# Patient Record
Sex: Female | Born: 1985
Health system: Southern US, Community
[De-identification: ages and names within clinical notes are randomized; demographics above are authoritative.]

## PROBLEM LIST (undated history)

## (undated) DIAGNOSIS — O139 Gestational [pregnancy-induced] hypertension without significant proteinuria, unspecified trimester: Secondary | ICD-10-CM

## (undated) DIAGNOSIS — B009 Herpesviral infection, unspecified: Secondary | ICD-10-CM

---

## 2004-01-29 ENCOUNTER — Emergency Department (HOSPITAL_COMMUNITY): Admission: EM | Admit: 2004-01-29 | Discharge: 2004-01-29 | Payer: Self-pay | Admitting: Emergency Medicine

## 2006-08-09 ENCOUNTER — Emergency Department (HOSPITAL_COMMUNITY): Admission: EM | Admit: 2006-08-09 | Discharge: 2006-08-09 | Payer: Self-pay | Admitting: *Deleted

## 2006-09-14 ENCOUNTER — Encounter: Admission: RE | Admit: 2006-09-14 | Discharge: 2006-09-14 | Payer: Self-pay | Admitting: Orthopedic Surgery

## 2006-10-06 ENCOUNTER — Encounter: Admission: RE | Admit: 2006-10-06 | Discharge: 2006-10-06 | Payer: Self-pay | Admitting: Sports Medicine

## 2007-01-10 ENCOUNTER — Emergency Department (HOSPITAL_COMMUNITY): Admission: EM | Admit: 2007-01-10 | Discharge: 2007-01-10 | Payer: Self-pay | Admitting: Family Medicine

## 2012-04-16 ENCOUNTER — Other Ambulatory Visit (HOSPITAL_COMMUNITY): Payer: Self-pay | Admitting: Nurse Practitioner

## 2012-04-16 DIAGNOSIS — R599 Enlarged lymph nodes, unspecified: Secondary | ICD-10-CM

## 2012-04-19 ENCOUNTER — Ambulatory Visit (HOSPITAL_COMMUNITY)
Admission: RE | Admit: 2012-04-19 | Discharge: 2012-04-19 | Disposition: A | Discharge status: Home / Self Care | Payer: 59 | Source: Ambulatory Visit | Attending: Nurse Practitioner | Admitting: Nurse Practitioner

## 2012-04-19 DIAGNOSIS — R220 Localized swelling, mass and lump, head: Secondary | ICD-10-CM | POA: Insufficient documentation

## 2012-04-19 DIAGNOSIS — R221 Localized swelling, mass and lump, neck: Secondary | ICD-10-CM | POA: Insufficient documentation

## 2012-04-19 DIAGNOSIS — R599 Enlarged lymph nodes, unspecified: Secondary | ICD-10-CM

## 2012-04-19 DIAGNOSIS — M542 Cervicalgia: Secondary | ICD-10-CM | POA: Insufficient documentation

## 2012-04-19 DIAGNOSIS — E042 Nontoxic multinodular goiter: Secondary | ICD-10-CM | POA: Insufficient documentation

## 2012-04-19 MED ORDER — IOHEXOL 300 MG/ML  SOLN
100.0000 mL | Freq: Once | INTRAMUSCULAR | Status: AC | PRN
  Administered 2012-04-19: 100 mL via INTRAVENOUS

## 2012-05-12 ENCOUNTER — Ambulatory Visit (INDEPENDENT_AMBULATORY_CARE_PROVIDER_SITE_OTHER): Payer: 59 | Admitting: Physician Assistant

## 2012-05-12 VITALS — BP 105/72 | HR 66 | Temp 98.1°F | Resp 16 | Ht 61.5 in | Wt 145.0 lb

## 2012-05-12 DIAGNOSIS — B373 Candidiasis of vulva and vagina: Secondary | ICD-10-CM

## 2012-05-12 DIAGNOSIS — J020 Streptococcal pharyngitis: Secondary | ICD-10-CM

## 2012-05-12 DIAGNOSIS — J069 Acute upper respiratory infection, unspecified: Secondary | ICD-10-CM

## 2012-05-12 DIAGNOSIS — J029 Acute pharyngitis, unspecified: Secondary | ICD-10-CM

## 2012-05-12 MED ORDER — FLUCONAZOLE 150 MG PO TABS: 150.0000 mg | ORAL_TABLET | Freq: Once | ORAL | Status: AC

## 2012-05-12 MED ORDER — MAGIC MOUTHWASH W/LIDOCAINE: 5.0000 mL | Freq: Four times a day (QID) | ORAL | Status: DC | PRN

## 2012-05-12 MED ORDER — AMOXICILLIN 875 MG PO TABS: 875.0000 mg | ORAL_TABLET | Freq: Two times a day (BID) | ORAL | Status: AC

## 2012-05-12 NOTE — Progress Notes (Signed)
    Patient ID: Rebecca Barnett MRN: [id number], DOB: [birth year], 26 y.o. Date of Encounter: 05/12/2012, 12:55 PM  Primary Physician: No primary provider on file.  Chief Complaint:  Chief Complaint  Patient presents with  . Sore Throat    3 days    HPI: 26 y.o. year old female presents with a three day history of sore throat. Subjective fever and chills. No cough, congestion, rhinorrhea, sinus pressure, otalgia, or headache. Normal hearing. No GI complaints. Able to swallow saliva, but hurts to do so. Decreased appetite secondary to sore throat.   No past medical history on file.   Home Meds: Prior to Admission medications   Medication Sig Start Date End Date Taking? Authorizing Provider  Multiple Vitamin (MULTIVITAMIN) tablet Take 1 tablet by mouth daily.   Yes Historical Provider, MD  Norgestim-Eth Estrad Triphasic (TRI-SPRINTEC PO) Take by mouth.   Yes Historical Provider, MD    Allergies: No Known Allergies  History   Social History  . Marital Status: Single    Spouse Name: N/A    Number of Children: N/A  . Years of Education: N/A   Occupational History  . Not on file.   Social History Main Topics  . Smoking status: Never Smoker   . Smokeless tobacco: Not on file  . Alcohol Use: Yes     social  . Drug Use: Not on file  . Sexually Active: Not on file   Other Topics Concern  . Not on file   Social History Narrative  . No narrative on file     Review of Systems: Constitutional: negative for night sweats or weight changes HEENT: see above Cardiovascular: negative for chest pain or palpitations Respiratory: negative for hemoptysis, wheezing, or shortness of breath Abdominal: negative for abdominal pain, nausea, vomiting or diarrhea Dermatological: negative for rash Neurologic: negative for headache   Physical Exam: Blood pressure 105/72, pulse 66, temperature 98.1 F (36.7 C), temperature source Oral, resp. rate 16, height 5' 1.5" (1.562 m), weight 145  lb (65.772 kg), last menstrual period 04/19/2012, SpO2 100.00%., Body mass index is 26.95 kg/(m^2). General: Well developed, well nourished, in no acute distress. Head: Normocephalic, atraumatic, eyes without discharge, sclera non-icteric, nares are patent. Bilateral auditory canals clear, TM's are without perforation, pearly grey with reflective cone of light bilaterally. No sinus TTP. Oral cavity moist, dentition normal. Posterior pharynx with post nasal drip and mild erythema. No peritonsillar abscess or tonsillar exudate. Neck: Supple. No thyromegaly. Full ROM. No lymphadenopathy. Lungs: Clear bilaterally to auscultation without wheezes, rales, or rhonchi. Breathing is unlabored. Heart: RRR with S1 S2. No murmurs, rubs, or gallops appreciated. Msk:  Strength and tone normal for age. Extremities: No clubbing or cyanosis. No edema. Neuro: Alert and oriented X 3. Moves all extremities spontaneously. CNII-XII grossly in tact. Psych:  Responds to questions appropriately with a normal affect.   Labs: Results for orders placed in visit on 05/12/12  POCT RAPID STREP A (OFFICE)      Component Value Range   Rapid Strep A Screen Negative  Negative    TC pending  ASSESSMENT AND PLAN:  26 y.o. year old female with strep pharyngitis -Amoxicillin 875 mg 1 po bid #20 no RF -DMM -Tylenol/Motrin prn -New tooth brush -Diflucan 150 mg #1 1 po at end of antibiotic if needed RF 1 -Rest/fluids -RTC precautions -RTC 3-5 days if no improvement  Signed, Eula Listen, PA-C 05/12/2012 12:55 PM

## 2012-05-14 NOTE — Progress Notes (Signed)
Quick Note:  Await final results.  Eula Listen, PA-C 05/14/2012 1:54 PM ______

## 2012-05-15 LAB — CULTURE, GROUP A STREP: Organism ID, Bacteria: NORMAL

## 2014-06-19 ENCOUNTER — Encounter (INDEPENDENT_AMBULATORY_CARE_PROVIDER_SITE_OTHER): Payer: Self-pay

## 2014-06-19 ENCOUNTER — Encounter: Payer: Self-pay | Admitting: Internal Medicine

## 2014-06-19 ENCOUNTER — Ambulatory Visit (INDEPENDENT_AMBULATORY_CARE_PROVIDER_SITE_OTHER): Payer: 59 | Admitting: Internal Medicine

## 2014-06-19 ENCOUNTER — Other Ambulatory Visit (INDEPENDENT_AMBULATORY_CARE_PROVIDER_SITE_OTHER): Payer: 59

## 2014-06-19 VITALS — BP 124/88 | HR 106 | Ht 61.0 in | Wt 152.0 lb

## 2014-06-19 DIAGNOSIS — F5104 Psychophysiologic insomnia: Secondary | ICD-10-CM | POA: Insufficient documentation

## 2014-06-19 DIAGNOSIS — E0590 Thyrotoxicosis, unspecified without thyrotoxic crisis or storm: Secondary | ICD-10-CM

## 2014-06-19 DIAGNOSIS — G4700 Insomnia, unspecified: Secondary | ICD-10-CM

## 2014-06-19 LAB — T4: T4 TOTAL: 13 ug/dL — AB (ref 5.0–12.5)

## 2014-06-19 LAB — TSH: TSH: 0.7 u[IU]/mL (ref 0.35–4.50)

## 2014-06-19 NOTE — Patient Instructions (Signed)
We can continue Ativan for now on an as-needed basis for sleep  Ok to take an occasional nap if really needed, but keep it short  Avoid caffeine after supper time- maybe earlier if you are sensitive  Order- referral to Dr Dellia CloudGutterman       Dx Chronic Insomnia  Order lab- TSH and T4    Dx hyperthryroid

## 2014-06-19 NOTE — Progress Notes (Signed)
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 She says she falls asleep easily between 10 and 11 PM most nights, between 1 and 2 AM on other nights. Wake toss and turn in several times during the night. Sleep latency 10 or 15 minutes. If up for bathroom may be hard to fall asleep again. Busy brain. Ready to sleep again at 5 AM. Married without reports that she snores much but husband complains about her restlessness. 2 cups of coffee in the morning. Weight gain since she got married 2 years ago. Trazodone help for a while and then stopped. Recently started Ativan 1 mg which seems to be helping some. She keeps bedroom cold and dark but admits staring at her lighted cell phone at bedtime. Extensive family history of insomnia. She reports having a sleep study a couple of years ago but can't say where and isn't sure who ordered it, never got that report. Dr. Tedra SenegalBland's office has no record.  Prior to Admission medications   Medication Sig Start Date End Date Taking? Authorizing Provider  LORazepam (ATIVAN) 1 MG tablet Take 1 mg by mouth every 8 (eight) hours as needed for anxiety.   Yes Historical Provider, MD  Multiple Vitamin (MULTIVITAMIN) tablet Take 1 tablet by mouth daily.   Yes Historical Provider, MD  Norgestim-Eth Estrad Triphasic (TRI-SPRINTEC PO) Take by mouth.   Yes Historical Provider, MD   Past Medical History  Diagnosis Date  . Arthritis    History reviewed. No pertinent past surgical history. Family History  Problem Relation Age of Onset  . Alcoholism Father   . Depression Father   . Hypertension Mother   . Insomnia Mother    History   Social History  . Marital Status: Single    Spouse Name: N/A    Number of Children: N/A  . Years of Education: N/A   Occupational History  . Not on file.   Social History Main Topics  . Smoking status: Never Smoker    . Smokeless tobacco: Not on file  . Alcohol Use: Yes     Comment: social  . Drug Use: Not on file  . Sexual Activity: Not on file   Other Topics Concern  . Not on file   Social History Narrative  . No narrative on file   ROS-see HPI Constitutional:   No-   weight loss, night sweats, fevers, chills, fatigue, lassitude. HEENT:  +headaches,no- difficulty swallowing, tooth/dental problems, sore throat,       No-  sneezing, itching, ear ache, nasal congestion, post nasal drip,  CV:  No-   chest pain, orthopnea, PND, swelling in lower extremities, anasarca,                                  dizziness, palpitations Resp: No-   shortness of breath with exertion or at rest.              No-   productive cough,  No non-productive cough,  No- coughing up of blood.              No-   change in color of mucus.  No- wheezing.   Skin: No-   rash or lesions. GI:  No-   heartburn, +indigestion, abdominal pain, nausea, vomiting, diarrhea,  change in bowel habits, loss of appetite GU: No-   dysuria, change in color of urine, no urgency or frequency.  No- flank pain. MS:  No-   joint pain or swelling.  No- decreased range of motion.  No- back pain. Neuro-     nothing unusual Psych:  No- change in mood or affect. + depression or anxiety.  No memory loss.  OBJ- Physical Exam General- Alert, Oriented, Affect-appropriate, Distress- none acute, medium build Skin- rash-none, lesions- none, excoriation- none Lymphadenopathy- none Head- atraumatic            Eyes- Gross vision intact, PERRLA, conjunctivae and secretions clear            Ears- Hearing, canals-normal            Nose- Clear, no-Septal dev, mucus, polyps, erosion, perforation             Throat- Mallampati III , mucosa clear , drainage- none, tonsils- atrophic Neck- flexible , trachea midline, no stridor , thyroid nl, carotid no bruit Chest - symmetrical excursion , unlabored           Heart/CV- RRR , no murmur , no gallop  ,  no rub, nl s1 s2                           - JVD- none , edema- none, stasis changes- none, varices- none           Lung- clear to P&A, wheeze- none, cough- none , dullness-none, rub- none           Chest wall-  Abd- tender-no, distended-no, bowel sounds-present, HSM- no Br/ Gen/ Rectal- Not done, not indicated Extrem- cyanosis- none, clubbing, none, atrophy- none, strength- nl Neuro- grossly intact to observation

## 2014-06-19 NOTE — Assessment & Plan Note (Addendum)
No difficulty initiating sleep but waking frequently with difficulty regaining sleep. History does not suggest sleep apnea. It would be good if we can find an old sleep study but she is really unclear about where that was done. We discussed sleep hygiene, medications and management of her reported depression/anxiety. Need to exclude hyperthyroidism Plan-okay to use Ativan intermittently as she is doing. Referral to behavioral health/ Dr Dawayne CirriGutterman's group to explore interaction of her anxiety with insomnia, lab for thyroid function

## 2014-08-19 ENCOUNTER — Ambulatory Visit: Payer: 59 | Admitting: Internal Medicine

## 2014-09-26 ENCOUNTER — Ambulatory Visit: Payer: 59 | Admitting: Internal Medicine

## 2014-10-06 ENCOUNTER — Encounter (INDEPENDENT_AMBULATORY_CARE_PROVIDER_SITE_OTHER): Payer: Self-pay

## 2014-10-06 ENCOUNTER — Ambulatory Visit (INDEPENDENT_AMBULATORY_CARE_PROVIDER_SITE_OTHER): Payer: 59 | Admitting: Internal Medicine

## 2014-10-06 ENCOUNTER — Encounter: Payer: Self-pay | Admitting: Internal Medicine

## 2014-10-06 VITALS — BP 118/64 | HR 88 | Ht 61.0 in | Wt 151.4 lb

## 2014-10-06 DIAGNOSIS — F5104 Psychophysiologic insomnia: Secondary | ICD-10-CM

## 2014-10-06 DIAGNOSIS — G4700 Insomnia, unspecified: Secondary | ICD-10-CM

## 2014-10-06 MED ORDER — CLONAZEPAM 0.5 MG PO TABS: ORAL_TABLET | ORAL | Status: DC

## 2014-10-06 NOTE — Assessment & Plan Note (Signed)
Primarily difficulty maintaining sleep, with frequent waking after sleep onset. We will first try a longer half-life med and see if Behavioral Health can help. Then consider sleep study or empiric therapy for limb movement.

## 2014-10-06 NOTE — Progress Notes (Signed)
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 She says she falls asleep easily between 10 and 11 PM most nights, between 1 and 2 AM on other nights. Wake toss and turn in several times during the night. Sleep latency 10 or 15 minutes. If up for bathroom may be hard to fall asleep again. Busy brain. Ready to sleep again at 5 AM. Married without reports that she snores much but husband complains about her restlessness. 2 cups of coffee in the morning. Weight gain since she got married 2 years ago. Trazodone help for a while and then stopped. Recently started Ativan 1 mg which seems to be helping some. She keeps bedroom cold and dark but admits staring at her lighted cell phone at bedtime. Extensive family history of insomnia. She reports having a sleep study a couple of years ago but can't say where and isn't sure who ordered it, never got that report. Dr. Tedra SenegalBland's office has no record.  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  FOLLOW FOR:  Chronic Insomnia, has only slept approx 5 hours last night, woke up with headache today; pt thinks she may have some food allergies and would like to be tested for allergies She noted nonspecific malaise after eating wheat/ pasta- mostly sleep disturbance not clearly associated with food. Lorazepam taken intermittently, not helping as mch any more. Has tried meditation, trazodone.  Mostly notes ok sleep onset, but waking during night. Husband doesn't notice snoring or movement disturbance.  [facility], but she was never contacted.   ROS-see HPI Constitutional:   No-   weight loss, night sweats, fevers, chills,+ fatigue, lassitude. HEENT:   +headaches,no- difficulty swallowing, tooth/dental problems, sore throat,       No-  sneezing, itching, ear ache, nasal congestion, post nasal drip,  CV:  No-   chest pain, orthopnea, PND, swelling in lower extremities, anasarca,                                  dizziness, palpitations Resp: No-   shortness of breath with exertion or at rest.              No-   productive cough,  No non-productive cough,  No- coughing up of blood.              No-   change in color of mucus.  No- wheezing.   Skin: No-   rash or lesions. GI:  No-   heartburn, +indigestion, abdominal pain, nausea, vomiting,  GU:  MS:  No-   joint pain or swelling.   Neuro-     nothing unusual Psych:  No- change in mood or affect. + depression or anxiety.  No memory loss.  OBJ- Physical Exam General- Alert, Oriented, Affect-appropriate, Distress- none acute, medium build Skin- rash-none, lesions- none, excoriation- none Lymphadenopathy- none Head- atraumatic            Eyes- Gross vision intact, PERRLA, conjunctivae and secretions clear            Ears- Hearing, canals-normal  Nose- Clear, no-Septal dev, mucus, polyps, erosion, perforation             Throat- Mallampati III , mucosa clear , drainage- none, tonsils- atrophic Neck- flexible , trachea midline, no stridor , thyroid nl, carotid no bruit Chest - symmetrical excursion , unlabored           Heart/CV- RRR , no murmur , no gallop  , no rub, nl s1 s2                           - JVD- none , edema- none, stasis changes- none, varices- none           Lung- clear to P&A, wheeze- none, cough- none , dullness-none, rub- none           Chest wall-  Abd-  Br/ Gen/ Rectal- Not done, not indicated Extrem- cyanosis- none, clubbing, none, atrophy- none, strength- nl Neuro- grossly intact to observation

## 2014-10-06 NOTE — Patient Instructions (Signed)
Script to try clonazepam for sleep. Try taking this 20-30 minutes before bedtime.  [facility]  [facility] Dellia CloudGutterman    Insomnia

## 2015-01-06 ENCOUNTER — Ambulatory Visit: Payer: 59 | Admitting: Internal Medicine

## 2015-05-01 LAB — OB RESULTS CONSOLE ANTIBODY SCREEN: Antibody Screen: NEGATIVE

## 2015-05-01 LAB — OB RESULTS CONSOLE ABO/RH: RH TYPE: POSITIVE

## 2015-05-01 LAB — OB RESULTS CONSOLE HEPATITIS B SURFACE ANTIGEN: HEP B S AG: NEGATIVE

## 2015-05-01 LAB — OB RESULTS CONSOLE RUBELLA ANTIBODY, IGM: RUBELLA: IMMUNE

## 2015-05-01 LAB — OB RESULTS CONSOLE RPR: RPR: NONREACTIVE

## 2015-05-01 LAB — OB RESULTS CONSOLE HIV ANTIBODY (ROUTINE TESTING): HIV: NONREACTIVE

## 2015-05-13 LAB — OB RESULTS CONSOLE GC/CHLAMYDIA
Chlamydia: NEGATIVE
Gonorrhea: NEGATIVE

## 2015-11-09 LAB — OB RESULTS CONSOLE GBS: STREP GROUP B AG: NEGATIVE

## 2015-11-30 ENCOUNTER — Inpatient Hospital Stay (HOSPITAL_COMMUNITY): Payer: 59 | Admitting: Anesthesiology

## 2015-11-30 ENCOUNTER — Encounter (HOSPITAL_COMMUNITY)
Admission: AD | Disposition: A | Discharge status: Home / Self Care | Payer: Self-pay | Source: Ambulatory Visit | Attending: Obstetrics and Gynecology

## 2015-11-30 ENCOUNTER — Inpatient Hospital Stay (HOSPITAL_COMMUNITY)
Admission: AD | Admit: 2015-11-30 | Discharge: 2015-12-03 | DRG: 765 | Disposition: A | Discharge status: Home / Self Care | Payer: 59 | Source: Ambulatory Visit | Attending: Obstetrics and Gynecology | Admitting: Obstetrics and Gynecology

## 2015-11-30 ENCOUNTER — Encounter (HOSPITAL_COMMUNITY): Payer: Self-pay

## 2015-11-30 DIAGNOSIS — Z8249 Family history of ischemic heart disease and other diseases of the circulatory system: Secondary | ICD-10-CM

## 2015-11-30 DIAGNOSIS — IMO0001 Reserved for inherently not codable concepts without codable children: Secondary | ICD-10-CM

## 2015-11-30 DIAGNOSIS — D62 Acute posthemorrhagic anemia: Secondary | ICD-10-CM | POA: Diagnosis not present

## 2015-11-30 DIAGNOSIS — Z3A39 39 weeks gestation of pregnancy: Secondary | ICD-10-CM

## 2015-11-30 DIAGNOSIS — O26893 Other specified pregnancy related conditions, third trimester: Secondary | ICD-10-CM | POA: Diagnosis present

## 2015-11-30 DIAGNOSIS — A6000 Herpesviral infection of urogenital system, unspecified: Secondary | ICD-10-CM | POA: Diagnosis present

## 2015-11-30 DIAGNOSIS — Z87891 Personal history of nicotine dependence: Secondary | ICD-10-CM | POA: Diagnosis not present

## 2015-11-30 DIAGNOSIS — R609 Edema, unspecified: Secondary | ICD-10-CM | POA: Diagnosis not present

## 2015-11-30 DIAGNOSIS — O9832 Other infections with a predominantly sexual mode of transmission complicating childbirth: Secondary | ICD-10-CM | POA: Diagnosis present

## 2015-11-30 DIAGNOSIS — O365990 Maternal care for other known or suspected poor fetal growth, unspecified trimester, not applicable or unspecified: Secondary | ICD-10-CM | POA: Diagnosis present

## 2015-11-30 DIAGNOSIS — O9081 Anemia of the puerperium: Secondary | ICD-10-CM | POA: Diagnosis not present

## 2015-11-30 HISTORY — DX: Herpesviral infection, unspecified: B00.9

## 2015-11-30 LAB — AMNISURE RUPTURE OF MEMBRANE (ROM) NOT AT ARMC: Amnisure ROM: POSITIVE

## 2015-11-30 LAB — CBC
HCT: 34.4 % — ABNORMAL LOW (ref 36.0–46.0)
HEMOGLOBIN: 11.8 g/dL — AB (ref 12.0–15.0)
MCH: 29.9 pg (ref 26.0–34.0)
MCHC: 34.3 g/dL (ref 30.0–36.0)
MCV: 87.3 fL (ref 78.0–100.0)
PLATELETS: 262 10*3/uL (ref 150–400)
RBC: 3.94 MIL/uL (ref 3.87–5.11)
RDW: 13.8 % (ref 11.5–15.5)
WBC: 10.6 10*3/uL — AB (ref 4.0–10.5)

## 2015-11-30 LAB — TYPE AND SCREEN
ABO/RH(D): A POS
ANTIBODY SCREEN: NEGATIVE

## 2015-11-30 LAB — RPR: RPR: NONREACTIVE

## 2015-11-30 LAB — ABO/RH: ABO/RH(D): A POS

## 2015-11-30 SURGERY — Surgical Case
Anesthesia: Regional

## 2015-11-30 MED ORDER — SIMETHICONE 80 MG PO CHEW
80.0000 mg | CHEWABLE_TABLET | ORAL | Status: DC
  Administered 2015-12-01 – 2015-12-02 (×2): 80 mg via ORAL
  Filled 2015-11-30 (×2): qty 1

## 2015-11-30 MED ORDER — SODIUM CHLORIDE 0.9 % IJ SOLN: 3.0000 mL | INTRAMUSCULAR | Status: DC | PRN

## 2015-11-30 MED ORDER — IBUPROFEN 600 MG PO TABS
600.0000 mg | ORAL_TABLET | Freq: Four times a day (QID) | ORAL | Status: DC
  Administered 2015-12-01 – 2015-12-03 (×10): 600 mg via ORAL
  Filled 2015-11-30 (×10): qty 1

## 2015-11-30 MED ORDER — LACTATED RINGERS IV SOLN
INTRAVENOUS | Status: DC
Start: 2015-11-30 — End: 2015-11-30
  Administered 2015-11-30: 13:00:00 via INTRAUTERINE

## 2015-11-30 MED ORDER — OXYCODONE-ACETAMINOPHEN 5-325 MG PO TABS
1.0000 | ORAL_TABLET | ORAL | Status: DC | PRN
  Administered 2015-12-01: 1 via ORAL
  Filled 2015-11-30: qty 1

## 2015-11-30 MED ORDER — SIMETHICONE 80 MG PO CHEW
80.0000 mg | CHEWABLE_TABLET | ORAL | Status: DC | PRN
  Filled 2015-11-30: qty 1

## 2015-11-30 MED ORDER — OXYTOCIN BOLUS FROM INFUSION: 500.0000 mL | INTRAVENOUS | Status: DC

## 2015-11-30 MED ORDER — PRENATAL MULTIVITAMIN CH
1.0000 | ORAL_TABLET | Freq: Every day | ORAL | Status: DC
  Administered 2015-12-01 – 2015-12-03 (×3): 1 via ORAL
  Filled 2015-11-30 (×3): qty 1

## 2015-11-30 MED ORDER — NALOXONE HCL 2 MG/2ML IJ SOSY: 1.0000 ug/kg/h | PREFILLED_SYRINGE | INTRAVENOUS | Status: DC | PRN

## 2015-11-30 MED ORDER — METHYLERGONOVINE MALEATE 0.2 MG PO TABS: 0.2000 mg | ORAL_TABLET | ORAL | Status: DC | PRN

## 2015-11-30 MED ORDER — DEXAMETHASONE SODIUM PHOSPHATE 4 MG/ML IJ SOLN
INTRAMUSCULAR | Status: AC
  Filled 2015-11-30: qty 1

## 2015-11-30 MED ORDER — MEPERIDINE HCL 25 MG/ML IJ SOLN: 6.2500 mg | INTRAMUSCULAR | Status: DC | PRN

## 2015-11-30 MED ORDER — NALBUPHINE HCL 10 MG/ML IJ SOLN: 5.0000 mg | Freq: Once | INTRAMUSCULAR | Status: DC | PRN

## 2015-11-30 MED ORDER — DEXAMETHASONE SODIUM PHOSPHATE 4 MG/ML IJ SOLN
INTRAMUSCULAR | Status: DC | PRN
  Administered 2015-11-30: 4 mg via INTRAVENOUS

## 2015-11-30 MED ORDER — LACTATED RINGERS IV SOLN
INTRAVENOUS | Status: DC | PRN
  Administered 2015-11-30: 18:00:00 via INTRAVENOUS

## 2015-11-30 MED ORDER — DIPHENHYDRAMINE HCL 25 MG PO CAPS: 25.0000 mg | ORAL_CAPSULE | ORAL | Status: DC | PRN

## 2015-11-30 MED ORDER — LIDOCAINE HCL (PF) 1 % IJ SOLN: 30.0000 mL | INTRAMUSCULAR | Status: DC | PRN

## 2015-11-30 MED ORDER — OXYCODONE-ACETAMINOPHEN 5-325 MG PO TABS: 1.0000 | ORAL_TABLET | ORAL | Status: DC | PRN

## 2015-11-30 MED ORDER — ZOLPIDEM TARTRATE 5 MG PO TABS: 5.0000 mg | ORAL_TABLET | Freq: Every evening | ORAL | Status: DC | PRN

## 2015-11-30 MED ORDER — BUPIVACAINE HCL (PF) 0.25 % IJ SOLN
INTRAMUSCULAR | Status: DC | PRN
  Administered 2015-11-30: 6 mL

## 2015-11-30 MED ORDER — BUPIVACAINE HCL (PF) 0.25 % IJ SOLN
INTRAMUSCULAR | Status: AC
  Filled 2015-11-30: qty 10

## 2015-11-30 MED ORDER — BISACODYL 10 MG RE SUPP: 10.0000 mg | Freq: Every day | RECTAL | Status: DC | PRN

## 2015-11-30 MED ORDER — DIBUCAINE 1 % RE OINT: 1.0000 "application " | TOPICAL_OINTMENT | RECTAL | Status: DC | PRN

## 2015-11-30 MED ORDER — FERROUS SULFATE 325 (65 FE) MG PO TABS
325.0000 mg | ORAL_TABLET | Freq: Two times a day (BID) | ORAL | Status: DC
  Administered 2015-12-01 – 2015-12-03 (×5): 325 mg via ORAL
  Filled 2015-11-30 (×5): qty 1

## 2015-11-30 MED ORDER — ACETAMINOPHEN 325 MG PO TABS: 650.0000 mg | ORAL_TABLET | ORAL | Status: DC | PRN

## 2015-11-30 MED ORDER — FLEET ENEMA 7-19 GM/118ML RE ENEM: 1.0000 | ENEMA | RECTAL | Status: DC | PRN

## 2015-11-30 MED ORDER — OXYTOCIN 40 UNITS IN LACTATED RINGERS INFUSION - SIMPLE MED: 62.5000 mL/h | INTRAVENOUS | Status: AC

## 2015-11-30 MED ORDER — KETOROLAC TROMETHAMINE 30 MG/ML IJ SOLN
INTRAMUSCULAR | Status: AC
  Filled 2015-11-30: qty 1

## 2015-11-30 MED ORDER — PROMETHAZINE HCL 25 MG/ML IJ SOLN: 6.2500 mg | INTRAMUSCULAR | Status: DC | PRN

## 2015-11-30 MED ORDER — PHENYLEPHRINE 40 MCG/ML (10ML) SYRINGE FOR IV PUSH (FOR BLOOD PRESSURE SUPPORT)
PREFILLED_SYRINGE | INTRAVENOUS | Status: AC
  Filled 2015-11-30: qty 10

## 2015-11-30 MED ORDER — LANOLIN HYDROUS EX OINT: 1.0000 "application " | TOPICAL_OINTMENT | CUTANEOUS | Status: DC | PRN

## 2015-11-30 MED ORDER — LACTATED RINGERS IV SOLN
500.0000 mL | INTRAVENOUS | Status: DC | PRN
  Administered 2015-11-30: 1000 mL via INTRAVENOUS
  Administered 2015-11-30: 500 mL via INTRAVENOUS

## 2015-11-30 MED ORDER — ONDANSETRON HCL 4 MG/2ML IJ SOLN
INTRAMUSCULAR | Status: AC
  Filled 2015-11-30: qty 2

## 2015-11-30 MED ORDER — BUPIVACAINE HCL (PF) 0.25 % IJ SOLN
INTRAMUSCULAR | Status: AC
  Filled 2015-11-30: qty 20

## 2015-11-30 MED ORDER — OXYTOCIN 10 UNIT/ML IJ SOLN
INTRAMUSCULAR | Status: AC
  Filled 2015-11-30: qty 4

## 2015-11-30 MED ORDER — MORPHINE SULFATE (PF) 0.5 MG/ML IJ SOLN
INTRAMUSCULAR | Status: AC
  Filled 2015-11-30: qty 10

## 2015-11-30 MED ORDER — MORPHINE SULFATE (PF) 0.5 MG/ML IJ SOLN
INTRAMUSCULAR | Status: DC | PRN
  Administered 2015-11-30: 4 mg via EPIDURAL

## 2015-11-30 MED ORDER — PHENYLEPHRINE 40 MCG/ML (10ML) SYRINGE FOR IV PUSH (FOR BLOOD PRESSURE SUPPORT)
80.0000 ug | PREFILLED_SYRINGE | INTRAVENOUS | Status: AC | PRN
  Administered 2015-11-30 (×3): 80 ug via INTRAVENOUS
  Filled 2015-11-30: qty 20

## 2015-11-30 MED ORDER — SODIUM BICARBONATE 8.4 % IV SOLN
INTRAVENOUS | Status: DC | PRN
  Administered 2015-11-30 (×3): 5 mL via EPIDURAL

## 2015-11-30 MED ORDER — KETOROLAC TROMETHAMINE 30 MG/ML IJ SOLN
30.0000 mg | Freq: Four times a day (QID) | INTRAMUSCULAR | Status: DC | PRN
  Administered 2015-11-30: 30 mg via INTRAMUSCULAR

## 2015-11-30 MED ORDER — WITCH HAZEL-GLYCERIN EX PADS: 1.0000 "application " | MEDICATED_PAD | CUTANEOUS | Status: DC | PRN

## 2015-11-30 MED ORDER — NALBUPHINE HCL 10 MG/ML IJ SOLN: 5.0000 mg | INTRAMUSCULAR | Status: DC | PRN

## 2015-11-30 MED ORDER — LIDOCAINE-EPINEPHRINE (PF) 2 %-1:200000 IJ SOLN
INTRAMUSCULAR | Status: AC
  Filled 2015-11-30: qty 20

## 2015-11-30 MED ORDER — SODIUM CHLORIDE 0.9 % IV SOLN: 250.0000 mL | INTRAVENOUS | Status: DC

## 2015-11-30 MED ORDER — SODIUM BICARBONATE 8.4 % IV SOLN
INTRAVENOUS | Status: AC
  Filled 2015-11-30: qty 50

## 2015-11-30 MED ORDER — FENTANYL CITRATE (PF) 100 MCG/2ML IJ SOLN
25.0000 ug | INTRAMUSCULAR | Status: DC | PRN
  Administered 2015-11-30: 50 ug via INTRAVENOUS

## 2015-11-30 MED ORDER — NALBUPHINE HCL 10 MG/ML IJ SOLN
5.0000 mg | INTRAMUSCULAR | Status: DC | PRN
  Administered 2015-11-30 – 2015-12-01 (×2): 5 mg via INTRAVENOUS
  Filled 2015-11-30 (×2): qty 1

## 2015-11-30 MED ORDER — TERBUTALINE SULFATE 1 MG/ML IJ SOLN
0.2500 mg | Freq: Once | INTRAMUSCULAR | Status: DC | PRN
Start: 2015-11-30 — End: 2015-11-30

## 2015-11-30 MED ORDER — DIPHENHYDRAMINE HCL 50 MG/ML IJ SOLN: 12.5000 mg | INTRAMUSCULAR | Status: DC | PRN

## 2015-11-30 MED ORDER — FLEET ENEMA 7-19 GM/118ML RE ENEM: 1.0000 | ENEMA | Freq: Every day | RECTAL | Status: DC | PRN

## 2015-11-30 MED ORDER — CEFAZOLIN SODIUM-DEXTROSE 2-3 GM-% IV SOLR
INTRAVENOUS | Status: DC | PRN
  Administered 2015-11-30: 2 g via INTRAVENOUS

## 2015-11-30 MED ORDER — NALOXONE HCL 0.4 MG/ML IJ SOLN: 0.4000 mg | INTRAMUSCULAR | Status: DC | PRN

## 2015-11-30 MED ORDER — KETOROLAC TROMETHAMINE 30 MG/ML IJ SOLN: 30.0000 mg | Freq: Four times a day (QID) | INTRAMUSCULAR | Status: DC | PRN

## 2015-11-30 MED ORDER — OXYTOCIN 40 UNITS IN LACTATED RINGERS INFUSION - SIMPLE MED
1.0000 m[IU]/min | INTRAVENOUS | Status: DC
  Administered 2015-11-30: 2 m[IU]/min via INTRAVENOUS
  Filled 2015-11-30: qty 1000

## 2015-11-30 MED ORDER — BUTORPHANOL TARTRATE 2 MG/ML IJ SOLN
2.0000 mg | INTRAMUSCULAR | Status: DC | PRN
  Administered 2015-11-30: 2 mg via INTRAVENOUS
  Filled 2015-11-30 (×2): qty 2

## 2015-11-30 MED ORDER — SCOPOLAMINE 1 MG/3DAYS TD PT72: 1.0000 | MEDICATED_PATCH | Freq: Once | TRANSDERMAL | Status: DC

## 2015-11-30 MED ORDER — LACTATED RINGERS IV SOLN
INTRAVENOUS | Status: DC
  Administered 2015-11-30 – 2015-12-01 (×3): via INTRAVENOUS

## 2015-11-30 MED ORDER — EPHEDRINE 5 MG/ML INJ: 10.0000 mg | INTRAVENOUS | Status: DC | PRN

## 2015-11-30 MED ORDER — ONDANSETRON HCL 4 MG/2ML IJ SOLN
4.0000 mg | Freq: Three times a day (TID) | INTRAMUSCULAR | Status: DC | PRN
  Administered 2015-12-01: 4 mg via INTRAVENOUS
  Filled 2015-11-30: qty 2

## 2015-11-30 MED ORDER — FENTANYL 2.5 MCG/ML BUPIVACAINE 1/10 % EPIDURAL INFUSION (WH - ANES)
14.0000 mL/h | INTRAMUSCULAR | Status: DC | PRN
Start: 2015-11-30 — End: 2015-11-30
  Filled 2015-11-30: qty 125

## 2015-11-30 MED ORDER — FENTANYL 2.5 MCG/ML BUPIVACAINE 1/10 % EPIDURAL INFUSION (WH - ANES): 14.0000 mL/h | INTRAMUSCULAR | Status: DC | PRN

## 2015-11-30 MED ORDER — OXYCODONE-ACETAMINOPHEN 5-325 MG PO TABS
2.0000 | ORAL_TABLET | ORAL | Status: DC | PRN
  Administered 2015-12-01 – 2015-12-02 (×6): 2 via ORAL
  Filled 2015-11-30 (×6): qty 2

## 2015-11-30 MED ORDER — LACTATED RINGERS IV SOLN
INTRAVENOUS | Status: DC
  Administered 2015-11-30 (×2): via INTRAVENOUS

## 2015-11-30 MED ORDER — SIMETHICONE 80 MG PO CHEW
80.0000 mg | CHEWABLE_TABLET | Freq: Three times a day (TID) | ORAL | Status: DC
  Administered 2015-12-01 – 2015-12-03 (×6): 80 mg via ORAL
  Filled 2015-11-30 (×6): qty 1

## 2015-11-30 MED ORDER — SCOPOLAMINE 1 MG/3DAYS TD PT72
MEDICATED_PATCH | TRANSDERMAL | Status: AC
  Filled 2015-11-30: qty 1

## 2015-11-30 MED ORDER — SODIUM CHLORIDE 0.9 % IJ SOLN: 3.0000 mL | Freq: Two times a day (BID) | INTRAMUSCULAR | Status: DC

## 2015-11-30 MED ORDER — FENTANYL 2.5 MCG/ML BUPIVACAINE 1/10 % EPIDURAL INFUSION (WH - ANES)
INTRAMUSCULAR | Status: DC | PRN
  Administered 2015-11-30: 14 mL/h via EPIDURAL

## 2015-11-30 MED ORDER — SCOPOLAMINE 1 MG/3DAYS TD PT72
MEDICATED_PATCH | TRANSDERMAL | Status: DC | PRN
  Administered 2015-11-30: 1 via TRANSDERMAL

## 2015-11-30 MED ORDER — METHYLERGONOVINE MALEATE 0.2 MG/ML IJ SOLN: 0.2000 mg | INTRAMUSCULAR | Status: DC | PRN

## 2015-11-30 MED ORDER — OXYTOCIN 40 UNITS IN LACTATED RINGERS INFUSION - SIMPLE MED: 62.5000 mL/h | INTRAVENOUS | Status: DC

## 2015-11-30 MED ORDER — DIPHENHYDRAMINE HCL 25 MG PO CAPS: 25.0000 mg | ORAL_CAPSULE | Freq: Four times a day (QID) | ORAL | Status: DC | PRN

## 2015-11-30 MED ORDER — OXYCODONE-ACETAMINOPHEN 5-325 MG PO TABS: 2.0000 | ORAL_TABLET | ORAL | Status: DC | PRN

## 2015-11-30 MED ORDER — LACTATED RINGERS IV SOLN
40.0000 [IU] | INTRAVENOUS | Status: DC | PRN
  Administered 2015-11-30: 40 [IU] via INTRAVENOUS

## 2015-11-30 MED ORDER — MENTHOL 3 MG MT LOZG
1.0000 | LOZENGE | OROMUCOSAL | Status: DC | PRN
  Administered 2015-12-02: 3 mg via ORAL
  Filled 2015-11-30: qty 9

## 2015-11-30 MED ORDER — SENNOSIDES-DOCUSATE SODIUM 8.6-50 MG PO TABS
2.0000 | ORAL_TABLET | ORAL | Status: DC
Start: 2015-12-01 — End: 2015-12-03
  Administered 2015-12-01 – 2015-12-02 (×3): 2 via ORAL
  Filled 2015-11-30 (×3): qty 2

## 2015-11-30 MED ORDER — CITRIC ACID-SODIUM CITRATE 334-500 MG/5ML PO SOLN
30.0000 mL | ORAL | Status: DC | PRN
  Administered 2015-11-30: 30 mL via ORAL
  Filled 2015-11-30: qty 15

## 2015-11-30 MED ORDER — FENTANYL CITRATE (PF) 100 MCG/2ML IJ SOLN
INTRAMUSCULAR | Status: AC
  Filled 2015-11-30: qty 2

## 2015-11-30 MED ORDER — ACETAMINOPHEN 500 MG PO TABS
1000.0000 mg | ORAL_TABLET | Freq: Four times a day (QID) | ORAL | Status: AC
  Administered 2015-12-01: 1000 mg via ORAL
  Filled 2015-11-30 (×2): qty 2

## 2015-11-30 MED ORDER — LACTATED RINGERS IV SOLN
INTRAVENOUS | Status: DC | PRN
  Administered 2015-11-30 (×3): via INTRAVENOUS

## 2015-11-30 MED ORDER — LIDOCAINE HCL (PF) 1 % IJ SOLN
INTRAMUSCULAR | Status: DC | PRN
  Administered 2015-11-30 (×2): 5 mL

## 2015-11-30 MED ORDER — ONDANSETRON HCL 4 MG/2ML IJ SOLN
4.0000 mg | Freq: Four times a day (QID) | INTRAMUSCULAR | Status: DC | PRN
  Administered 2015-11-30: 4 mg via INTRAVENOUS

## 2015-11-30 SURGICAL SUPPLY — 44 items
BARRIER ADHS 3X4 INTERCEED (GAUZE/BANDAGES/DRESSINGS) ×3 IMPLANT
BENZOIN TINCTURE PRP APPL 2/3 (GAUZE/BANDAGES/DRESSINGS) ×3 IMPLANT
CLAMP CORD UMBIL (MISCELLANEOUS) IMPLANT
CLOSURE WOUND 1/2 X4 (GAUZE/BANDAGES/DRESSINGS) ×2
CLOTH BEACON ORANGE TIMEOUT ST (SAFETY) ×3 IMPLANT
CONTAINER PREFILL 10% NBF 15ML (MISCELLANEOUS) IMPLANT
DRAPE C SECTION CLR SCREEN (DRAPES) ×3 IMPLANT
DRAPE SHEET LG 3/4 BI-LAMINATE (DRAPES) ×3 IMPLANT
DRSG OPSITE POSTOP 4X10 (GAUZE/BANDAGES/DRESSINGS) ×3 IMPLANT
DURAPREP 26ML APPLICATOR (WOUND CARE) ×3 IMPLANT
ELECT REM PT RETURN 9FT ADLT (ELECTROSURGICAL) ×3
ELECTRODE REM PT RTRN 9FT ADLT (ELECTROSURGICAL) ×1 IMPLANT
EXTRACTOR VACUUM M CUP 4 TUBE (SUCTIONS) IMPLANT
EXTRACTOR VACUUM M CUP 4' TUBE (SUCTIONS)
GLOVE BIOGEL PI IND STRL 7.0 (GLOVE) ×2 IMPLANT
GLOVE BIOGEL PI INDICATOR 7.0 (GLOVE) ×4
GLOVE ECLIPSE 6.5 STRL STRAW (GLOVE) ×3 IMPLANT
GOWN STRL REUS W/TWL LRG LVL3 (GOWN DISPOSABLE) ×6 IMPLANT
KIT ABG SYR 3ML LUER SLIP (SYRINGE) IMPLANT
NEEDLE HYPO 22GX1.5 SAFETY (NEEDLE) ×3 IMPLANT
NEEDLE HYPO 25X5/8 SAFETYGLIDE (NEEDLE) IMPLANT
NS IRRIG 1000ML POUR BTL (IV SOLUTION) ×3 IMPLANT
PACK C SECTION WH (CUSTOM PROCEDURE TRAY) ×3 IMPLANT
PAD OB MATERNITY 4.3X12.25 (PERSONAL CARE ITEMS) ×3 IMPLANT
RTRCTR C-SECT PINK 25CM LRG (MISCELLANEOUS) IMPLANT
STRIP CLOSURE SKIN 1/2X4 (GAUZE/BANDAGES/DRESSINGS) ×4 IMPLANT
SUT CHROMIC GUT AB #0 18 (SUTURE) IMPLANT
SUT MNCRL 0 VIOLET CTX 36 (SUTURE) ×3 IMPLANT
SUT MON AB 2-0 SH 27 (SUTURE)
SUT MON AB 2-0 SH27 (SUTURE) IMPLANT
SUT MON AB 3-0 SH 27 (SUTURE)
SUT MON AB 3-0 SH27 (SUTURE) IMPLANT
SUT MON AB 4-0 PS1 27 (SUTURE) IMPLANT
SUT MONOCRYL 0 CTX 36 (SUTURE) ×6
SUT PLAIN 2 0 (SUTURE)
SUT PLAIN 2 0 XLH (SUTURE) ×3 IMPLANT
SUT PLAIN ABS 2-0 CT1 27XMFL (SUTURE) IMPLANT
SUT VIC AB 0 CT1 36 (SUTURE) ×6 IMPLANT
SUT VIC AB 2-0 CT1 27 (SUTURE) ×2
SUT VIC AB 2-0 CT1 TAPERPNT 27 (SUTURE) ×1 IMPLANT
SUT VIC AB 4-0 PS2 27 (SUTURE) ×3 IMPLANT
SYR CONTROL 10ML LL (SYRINGE) ×3 IMPLANT
TOWEL OR 17X24 6PK STRL BLUE (TOWEL DISPOSABLE) ×3 IMPLANT
TRAY FOLEY CATH SILVER 14FR (SET/KITS/TRAYS/PACK) IMPLANT

## 2015-11-30 NOTE — MAU Note (Signed)
Pt states that she woke up around 315, she felt a pop and noticed a big gush of yellow fluid. Contractions every 4-6 mins. Has felt baby move once since 0315.

## 2015-11-30 NOTE — Anesthesia Preprocedure Evaluation (Signed)
Anesthesia Evaluation  Patient identified by MRN, date of birth, ID band Patient awake    Reviewed: Allergy & Precautions, NPO status , Patient's Chart, lab work & pertinent test results  Airway Mallampati: II       Dental  (+) Dental Advisory Given   Pulmonary neg pulmonary ROS, former smoker,    breath sounds clear to auscultation       Cardiovascular negative cardio ROS   Rhythm:Regular     Neuro/Psych negative neurological ROS     GI/Hepatic negative GI ROS, Neg liver ROS,   Endo/Other  negative endocrine ROS  Renal/GU negative Renal ROS     Musculoskeletal   Abdominal   Peds negative pediatric ROS (+)  Hematology negative hematology ROS (+)   Anesthesia Other Findings Insomnia  Reproductive/Obstetrics (+) Pregnancy                             Anesthesia Physical  Anesthesia Plan  ASA: II  Anesthesia Plan: Epidural   Post-op Pain Management:    Induction:   Airway Management Planned:   Additional Equipment:   Intra-op Plan:   Post-operative Plan:   Informed Consent: I have reviewed the patients History and Physical, chart, labs and discussed the procedure including the risks, benefits and alternatives for the proposed anesthesia with the patient or authorized representative who has indicated his/her understanding and acceptance.     Plan Discussed with:   Anesthesia Plan Comments:         Anesthesia Quick Evaluation

## 2015-11-30 NOTE — Op Note (Signed)
NAMEthelda Chick:  Kwasnik, Daje           ACCOUNT NO.:  [id number]  MEDICAL RECORD NO.:  [id number]  LOCATION:  9146                          FACILITY:  [facility]  PHYSICIAN:  Maxie BetterSheronette Delona Clasby, M.D.DATE OF BIRTH:  [birth year]  DATE OF PROCEDURE:  11/30/2015 DATE OF DISCHARGE:                              OPERATIVE REPORT   PREOPERATIVE DIAGNOSES:  Fetal intolerance to labor, arrest of the dilatation, term gestation.  PROCEDURE:  Primary cesarean section, Kerr hysterotomy.  POSTOPERATIVE DIAGNOSES:  Fetal intolerance to labor, right occiput posterior presentation, arrest of the dilatation, term gestation.  ANESTHESIA:  Epidural.  SURGEON:  Maxie BetterSheronette Louis Ivery, MD  ASSISTANT:  Donette LarryMelanie Bhambri, CNM  DESCRIPTION OF PROCEDURE:  Under adequate epidural anesthesia, the patient was placed in the supine position with a left lateral tilt.  She was sterilely prepped and draped in usual fashion.  Indwelling Foley catheter was already in place.  A 0.25% Marcaine was injected along the planned Pfannenstiel skin incision site.  Pfannenstiel skin incision was then made, carried down to the rectus fascia.  Rectus fascia was opened transversely.  The rectus fascia was then bluntly and sharply dissected off the rectus muscle in superior and inferior fashion.  The rectus muscle was split in the midline.  The parietal peritoneum was entered bluntly and extended. Clear serous fluid was noted.  The Alexis retractor was then placed.  Vesicouterine peritoneum was opened transversely.  The bladder was then gently dissected off the lower uterine segment and displaced inferiorly with a bladder retractor.  A curvilinear low transverse uterine incision was then made and extended with bandage scissors.  The copious light meconium stained yellow fluid was noted.  Loop of cord was noted.  The baby's right fingers was also noted in the field.  Subsequent delivery of a live female from the right occiput posterior position  was accomplished with the cord draped around the mid forehead and the neck which was then reduced.  Baby was delivered.  Baby was bulb suctioned in the abdomen.  Cord was clamped, cut.  The baby was transferred to the awaiting pediatrician who assigned Apgars of 8 and 9 at 1 and 5 minute respectively.  The placenta which was posterior was manually removed.  Uterine cavity was cleaned of debris.  Uterine incision was then closed in 2 layers, the first layer with 0 Monocryl running locked stitch, second layer was imbricated using 0 Monocryl suture.  Good hemostasis was noted.  Normal tubes and ovaries were noted bilaterally.  Two small left paratubal cyst was noted, they remained.  Abdomen was irrigated and suctioned.  The self-retaining retractor was removed.  Interceed in inverted T-fashion was then placed. The parietal peritoneum was then closed with 2-0 Vicryl.  The rectus fascia was then closed with 0 Vicryl x2.  The subcutaneous area was irrigated, small bleeders cauterized.  Interrupted 2-0 plain sutures placed and the skin was approximated with 4-0 Vicryl subcuticular sutures and Steri-Strips were placed.  SPECIMENS:  Placenta sent to Pathology.  ESTIMATED BLOOD LOSS:  800 mL.  INTRAOPERATIVE FLUID:  3 L.  URINE OUTPUT:  225 mL urine.  COUNTS:  Sponge and instrument counts x2 was correct.  COMPLICATION:  None.  The patient tolerated  the procedure well, was transferred to recovery in stable condition.     Maxie Better, M.D.     [place]/[facility]  D:  11/30/2015  T:  11/30/2015  Job:  [id number]

## 2015-11-30 NOTE — Consult Note (Signed)
[facility]  Delivery Note:  C-section       11/30/2015  6:24 PM  I was called to the operating room at the request of the patient's obstetrician (Dr. Cousins) for a repeat c-section.  PRENATAL HX:  29 y/o G1P0 at 39 and 4/[redacted] weeks GA admitted today in active labor with ROM at 0300 this morning (ROM ~15 hours).  Fluid was notable for thick meconium.  Delivery was by c-section for failure to progress  DELIVERY:  Infant was vigorous at delivery, requiring no resuscitation other than standard warming, drying and stimulation.  APGARs 8 and 9.  Exam within normal limits.  After 5 minutes, baby left with nurse to assist parents with skin-to-skin care.   _____________________ Electronically Signed By: Alexyss Balzarini, MD Neonatologist  

## 2015-11-30 NOTE — Brief Op Note (Signed)
11/30/2015  6:51 PM  PATIENT:  Stanford BreedKennitrish N Carrick  29 y.o. female  PRE-OPERATIVE DIAGNOSIS:  Fetal intolerance to labor, arrest of dilation, term gestation  POST-OPERATIVE DIAGNOSIS:  Fetal intolerance to labor, arrest of dilation, term gestation  PROCEDURE:  Primary cesarean section. Sharl MaKerr hysterotomy  SURGEON:  Surgeon(s) and Role:    * Maxie BetterSheronette Arbadella Kimbler, MD - Primary  PHYSICIAN ASSISTANT:   ASSISTANTS: Donette LarryMelanie Bhambri, CNM   ANESTHESIA:   epidural Findings: live  Female, CAN x1  and  Around mid face, right hand in field. Nl tubes and ovaries, light yellow mec fluid. Post placenta Apgar 8/9 EBL:  Total I/O In: 3000 [I.V.:3000] Out: 1025 [Urine:225; Blood:800]  BLOOD ADMINISTERED:none  DRAINS: none   LOCAL MEDICATIONS USED:  MARCAINE     SPECIMEN:  Source of Specimen:  placenta  DISPOSITION OF SPECIMEN:  PATHOLOGY  COUNTS:  YES  TOURNIQUET:  * No tourniquets in log *  DICTATION: .Other Dictation: Dictation Number [id number]  PLAN OF CARE: Admit to inpatient   PATIENT DISPOSITION:  PACU - hemodynamically stable.   Delay start of Pharmacological VTE agent (>24hrs) due to surgical blood loss or risk of bleeding: no

## 2015-11-30 NOTE — Progress Notes (Signed)
Rebecca Barnett is a 29 y.o. G1P0 at 9363w4d by ultrasound admitted for rupture of membranes  Subjective: Chief Complaint  Patient presents with  . Labor Eval  notes some rectal pressure Epidural Pitocin 6 miu Objective: BP 117/61 mmHg  Pulse 87  Temp(Src) 98.4 F (36.9 C) (Oral)  Resp 20  Ht 5' 1.5" (1.562 m)  Wt 90.266 kg (199 lb)  BMI 37.00 kg/m2  SpO2 98%      FHT:  FHR: 125 bpm, variability: moderate,  accelerations:  Present,  decelerations:  Present variable decel UC:   irregular, every 2-4 minutes SVE:   4 cm dilated, edematous cervix station, -2 IUPC/ amnioinfusion.  ISE placed  Labs: Lab Results  Component Value Date   WBC 10.6* 11/30/2015   HGB 11.8* 11/30/2015   HCT 34.4* 11/30/2015   MCV 87.3 11/30/2015   PLT 262 11/30/2015    Assessment / Plan: Arrest in active phase of labor Term gestation P) right exaggerated sims. Cont pitocin/amnioinfusion Anticipated MOD:  guarded  Rebecca Barnett A 11/30/2015, 2:29 PM

## 2015-11-30 NOTE — Anesthesia Preprocedure Evaluation (Addendum)
Anesthesia Evaluation  Patient identified by MRN, date of birth, ID band Patient awake    Reviewed: Allergy & Precautions, NPO status , Patient's Chart, lab work & pertinent test results  Airway Mallampati: II       Dental  (+) Dental Advisory Given   Pulmonary neg pulmonary ROS, former smoker,    breath sounds clear to auscultation       Cardiovascular negative cardio ROS   Rhythm:Regular     Neuro/Psych negative neurological ROS     GI/Hepatic negative GI ROS, Neg liver ROS,   Endo/Other  negative endocrine ROS  Renal/GU negative Renal ROS     Musculoskeletal   Abdominal   Peds negative pediatric ROS (+)  Hematology negative hematology ROS (+)   Anesthesia Other Findings Insomnia  Reproductive/Obstetrics (+) Pregnancy                           Anesthesia Physical Anesthesia Plan  ASA: II  Anesthesia Plan: Epidural   Post-op Pain Management:    Induction:   Airway Management Planned:   Additional Equipment:   Intra-op Plan:   Post-operative Plan:   Informed Consent: I have reviewed the patients History and Physical, chart, labs and discussed the procedure including the risks, benefits and alternatives for the proposed anesthesia with the patient or authorized representative who has indicated his/her understanding and acceptance.     Plan Discussed with:   Anesthesia Plan Comments:         Anesthesia Quick Evaluation                                  Anesthesia Evaluation  Patient identified by MRN, date of birth, ID band Patient awake    Reviewed: Allergy & Precautions, NPO status , Patient's Chart, lab work & pertinent test results  Airway Mallampati: II       Dental  (+) Dental Advisory Given   Pulmonary neg pulmonary ROS, former smoker,    breath sounds clear to auscultation       Cardiovascular negative cardio ROS   Rhythm:Regular      Neuro/Psych negative neurological ROS     GI/Hepatic negative GI ROS, Neg liver ROS,   Endo/Other  negative endocrine ROS  Renal/GU negative Renal ROS     Musculoskeletal   Abdominal   Peds negative pediatric ROS (+)  Hematology negative hematology ROS (+)   Anesthesia Other Findings Insomnia  Reproductive/Obstetrics (+) Pregnancy                             Anesthesia Physical  Anesthesia Plan  ASA: II  Anesthesia Plan: Epidural   Post-op Pain Management:    Induction:   Airway Management Planned:   Additional Equipment:   Intra-op Plan:   Post-operative Plan:   Informed Consent: I have reviewed the patients History and Physical, chart, labs and discussed the procedure including the risks, benefits and alternatives for the proposed anesthesia with the patient or authorized representative who has indicated his/her understanding and acceptance.     Plan Discussed with:   Anesthesia Plan Comments:         Anesthesia Quick Evaluation

## 2015-11-30 NOTE — MAU Note (Signed)
PT  SAYS  SHE   THINKS  SROM  AT  0300.   VE IN OFFICE - FT- 1  CM.     HAS  HX  OF  HSV - LAST OUTBREAK - NONE  DURING  PREG-  NO VALTREX.       NO MRSA.    GBS-  NEG

## 2015-11-30 NOTE — Transfer of Care (Signed)
Immediate Anesthesia Transfer of Care Note  Patient: Rebecca Barnett  Procedure(s) Performed: Procedure(s): CESAREAN SECTION (N/A)  Patient Location: PACU  Anesthesia Type:Epidural  Level of Consciousness: awake, alert  and oriented  Airway & Oxygen Therapy: Patient Spontanous Breathing  Post-op Assessment: Report given to RN and Post -op Vital signs reviewed and stable  Post vital signs: Reviewed and stable  Last Vitals:  Filed Vitals:   11/30/15 1736 11/30/15 1739  BP: 137/74 128/80  Pulse:    Temp:    Resp:      Complications: No apparent anesthesia complications

## 2015-11-30 NOTE — H&P (Signed)
Rebecca Barnett is a 29 y.o. female presenting for admission due to SROm @ 3 am clear fluid. (+) irreg ctx. GBS cx neg . Maternal Medical History:  Reason for admission: Rupture of membranes and contractions.   Contractions: Onset was 3-5 hours ago.   Frequency: irregular.    Fetal activity: Perceived fetal activity is normal.    Prenatal complications: no prenatal complications   OB History    Gravida Para Term Preterm AB TAB SAB Ectopic Multiple Living   1              Past Medical History  Diagnosis Date  . HSV-2 (herpes simplex virus 2) infection    History reviewed. No pertinent past surgical history. Family History: family history includes Alcoholism in her father; Depression in her father; Hypertension in her mother; Insomnia in her mother. Social History:  reports that she has quit smoking. She does not have any smokeless tobacco history on file. She reports that she drinks alcohol. She reports that she does not use illicit drugs.   Prenatal Transfer Tool  Maternal Diabetes: No Genetic Screening: Normal Maternal Ultrasounds/Referrals: Normal Fetal Ultrasounds or other Referrals:  None Maternal Substance Abuse:  No Significant Maternal Medications:  None Significant Maternal Lab Results:  Lab values include: Group B Strep negative Other Comments:  None  ROS neg   Blood pressure 78/43, pulse 71, temperature 98.2 F (36.8 C), temperature source Oral, resp. rate 18, height 5' 1.5" (1.562 m), weight 90.266 kg (199 lb), SpO2 99 %. Exam Physical Exam  Constitutional: She is oriented to person, place, and time. She appears well-developed and well-nourished.  HENT:  Head: Atraumatic.  Eyes: EOM are normal.  Neck: Neck supple.  Cardiovascular: Regular rhythm.   Respiratory: Breath sounds normal.  GI: Soft.  Musculoskeletal: Normal range of motion. She exhibits edema.  Neurological: She is alert and oriented to person, place, and time.  Skin: Skin is warm and  dry.  Psychiatric: She has a normal mood and affect.   VE 2/80/-2 midposition  Prenatal labs: ABO, Rh: --/--/A POS (12/05 0630) Antibody: NEG (12/05 0630) Rubella: Immune (05/06 0000) RPR: Nonreactive (05/06 0000)  HBsAg: Negative (05/06 0000)  HIV: Non-reactive (05/06 0000)  GBS: Negative (11/14 0000)   Assessment/Plan: SROM Early labor Term w/ SGA baby P) admit routine labs. Pitocin augmentation. Epidural/analgesic  Madalynne Gutmann A 11/30/2015, 12:25 PM

## 2015-11-30 NOTE — MAU Note (Signed)
PT SAYS 2-3 WEEKS  AGO  SHE FELT TINGLING ON PERINEUM- NO BUMPS DEV- NONE NOW - DID NOT TAKE ANY MED

## 2015-11-30 NOTE — Lactation Note (Signed)
This note was copied from the chart of Rebecca Barnett. During baby's admission assessment, RN asked Mom if she plans to breast feed or bottle feed, pt answers she would like to do both.  Currently not requesting formula

## 2015-11-30 NOTE — Progress Notes (Signed)
Called for decelerations Tracing reviewed.  Pitocin VE 4/80/-2 (+) FHR decel noted down to 79 BPM with digital exam. nonresponsive to abd stimulation and scalp stim Persistent ROP.   VHQ:IONGEMP:Fetal intolerance to labor Arrest of dilation P) recommend C/S. Risk of surgery discussed including infection, bleeding, poss need for  Blood transfusion and its risk, risk to surrounding organ structures. Internal scar tissue. Consent  Signed. To OR

## 2015-11-30 NOTE — Progress Notes (Signed)
Rebecca Barnett is a 29 y.o. G1P0 at 2929w4d by LMP admitted for active labor, rupture of membranes  Subjective: Comfortable with epidural  Objective: BP 111/95 mmHg  Pulse 82  Temp(Src) 98.2 F (36.8 C) (Oral)  Resp 18  Ht 5' 1.5" (1.562 m)  Wt 90.266 kg (199 lb)  BMI 37.00 kg/m2  SpO2 100%      FHT:  120-130, BTBV 5-25, occ accels, occ variable decels UC:   irregular, every 2-7 minutes SVE:   Dilation: 5 Effacement (%): 80 Station: -2 Exam by:: Corrie DandyMary Early RN  IUPC placed without difficulty, Amnioinfusion started   Labs: Lab Results  Component Value Date   WBC 10.6* 11/30/2015   HGB 11.8* 11/30/2015   HCT 34.4* 11/30/2015   MCV 87.3 11/30/2015   PLT 262 11/30/2015    Assessment / Plan: Term IUP in active labor  Thick meconium SROM  Labor: Progressing normally- amnioinfusion started Preeclampsia:  no signs or symptoms of toxicity Fetal Wellbeing:  Category I and Category II Pain Control:  Epidural I/D:  n/a Anticipated MOD:  guarded  Jeffery Bachmeier J 11/30/2015, 12:34 PM

## 2015-11-30 NOTE — Anesthesia Procedure Notes (Signed)
Epidural Patient location during procedure: OB Start time: 11/30/2015 10:55 AM End time: 11/30/2015 11:14 AM  Staffing Anesthesiologist: Sebastian AcheMANNY, Teairra Millar  Preanesthetic Checklist Completed: patient identified, site marked, surgical consent, pre-op evaluation, timeout performed, IV checked, risks and benefits discussed and monitors and equipment checked  Epidural Patient position: sitting Prep: site prepped and draped and DuraPrep Patient monitoring: heart rate, continuous pulse ox and blood pressure Approach: midline Location: L3-L4 Injection technique: LOR air  Needle:  Needle type: Tuohy  Needle gauge: 17 G Needle length: 9 cm and 9 Needle insertion depth: 6 cm Catheter type: closed end flexible Catheter size: 20 Guage Catheter at skin depth: 14 cm Test dose: negative and 1.5% lidocaine with Epi 1:200 K  Assessment Events: blood not aspirated, injection not painful, no injection resistance, negative IV test and no paresthesia  Additional Notes   Patient tolerated the insertion well without complications.Reason for block:procedure for pain

## 2015-11-30 NOTE — Progress Notes (Signed)
Called to see pt regarding fetal tracing at request of Dr. Cherly Hensenousins Subjective:   Comfortable with epidural, hands and knees position.  Objective:   VS: Blood pressure 120/61, pulse 88, temperature 97.8 F (36.6 C), temperature source Axillary, resp. rate 18, height 5' 1.5" (1.562 m), weight 90.266 kg (199 lb), SpO2 98 %. FHR: baseline 130 / variability mod / accelerations + / variable decelerations (episodic) Toco: contractions every 2 minutes Cervix: 5/80/-2, by Denyse AmassBhambri, CNM Membranes: clear Pitocin: 6 mu/min MVU: 180  Assessment:  Labor: arrested first stage FHR category II  Plan:  Maternal repositioning to Rt lateral with peanut ball, continue amnioinfusion, monitor closely, likely need for CS. Dr. Cherly Hensenousins updated, enroute to evaluate pt.     Rebecca Barnett, Rebecca Barnett, N MSN, CNM 11/30/2015, 4:37 PM

## 2015-11-30 NOTE — Anesthesia Postprocedure Evaluation (Signed)
Anesthesia Post Note  Patient: Rebecca Barnett  Procedure(s) Performed: Procedure(s) (LRB): CESAREAN SECTION (N/A)  Patient location during evaluation: PACU Anesthesia Type: Epidural Level of consciousness: awake and alert and oriented Pain management: pain level controlled Vital Signs Assessment: post-procedure vital signs reviewed and stable Respiratory status: spontaneous breathing, nonlabored ventilation and respiratory function stable Cardiovascular status: blood pressure returned to baseline Postop Assessment: no headache, no backache, epidural receding, patient able to bend at knees and no signs of nausea or vomiting    Last Vitals:  Filed Vitals:   11/30/15 2000 11/30/15 2030  BP: 133/89 139/81  Pulse: 95 95  Temp: 35.8 C 36.7 C  Resp: 21 18    Last Pain:  Filed Vitals:   11/30/15 2047  PainSc: 4                  [name] A.

## 2015-12-01 ENCOUNTER — Encounter (HOSPITAL_COMMUNITY): Payer: Self-pay | Admitting: Obstetrics and Gynecology

## 2015-12-01 LAB — CBC
HCT: 27.7 % — ABNORMAL LOW (ref 36.0–46.0)
Hemoglobin: 9.7 g/dL — ABNORMAL LOW (ref 12.0–15.0)
MCH: 30.6 pg (ref 26.0–34.0)
MCHC: 35 g/dL (ref 30.0–36.0)
MCV: 87.4 fL (ref 78.0–100.0)
PLATELETS: 250 10*3/uL (ref 150–400)
RBC: 3.17 MIL/uL — AB (ref 3.87–5.11)
RDW: 13.8 % (ref 11.5–15.5)
WBC: 24.1 10*3/uL — ABNORMAL HIGH (ref 4.0–10.5)

## 2015-12-01 MED ORDER — HYDROCORTISONE 1 % EX CREA
TOPICAL_CREAM | Freq: Three times a day (TID) | CUTANEOUS | Status: DC | PRN
  Administered 2015-12-01: 23:00:00 via TOPICAL
  Filled 2015-12-01: qty 28

## 2015-12-01 NOTE — Addendum Note (Signed)
Addendum  created 12/01/15 [id number] by Earmon PhoenixValerie P Christal Lagerstrom, CRNA   Modules edited: Clinical Notes   Clinical Notes:  File: [id number]

## 2015-12-01 NOTE — Progress Notes (Signed)
Patient ID: Stanford BreedKennitrish N Thul, female   DOB: 1986/05/01, 29 y.o.   MRN: [id number] Subjective: S/P Primary Cesarean Delivery d/t Arrest of Dilation / Fetal Intolerance to Labor POD# 1 Information for the patient's newborn:  Herbert SetaBracey, Boy Jasleen [[id number]][[id number]]  female  / circ planning  Reports feeling well. Feeding: breast Patient reports tolerating PO.  Breast symptoms: none Pain controlled with ibuprofen (OTC) and narcotic analgesics including Percocet Denies HA/SOB/C/P/N/V/dizziness. Flatus present. No BM. She reports vaginal bleeding as normal, without clots.  She is ambulating, urinating without difficulty x 1.     Objective:   VS:  Filed Vitals:   11/30/15 2130 11/30/15 2230 12/01/15 0000 12/01/15 0500  BP: 142/92 129/75  120/61  Pulse: 92 93  88  Temp: 98.4 F (36.9 C) 98.6 F (37 C) 98.7 F (37.1 C) 99.1 F (37.3 C)  TempSrc: Oral Oral Oral Oral  Resp: 18 18 18 16   Height:      Weight:      SpO2: 98% 95% 98% 95%     Intake/Output Summary (Last 24 hours) at 12/01/15 0844 Last data filed at 12/01/15 0604  Gross per 24 hour  Intake 7712.5 ml  Output   1025 ml  Net 6687.5 ml        Recent Labs  11/30/15 0630 12/01/15 0545  WBC 10.6* 24.1*  HGB 11.8* 9.7*  HCT 34.4* 27.7*  PLT 262 250     Blood type: A POS (12/05 0630)  Rubella: Immune (05/06 0000)     Physical Exam:   General: alert, cooperative and no distress  CV: Regular rate and rhythm, S1S2 present or without murmur or extra heart sounds  Resp: clear  Abdomen: soft, nontender, normal bowel sounds  Incision: clean, dry and intact  Uterine Fundus: firm, 1 FB below umbilicus, nontender  Lochia: minimal  Ext: edema 1+ and Homans sign is negative, no sign of DVT   Assessment/Plan: 29 y.o.   POD# 1.  S/P Cesarean Delivery.  Indications: arrest of dilation / fetal intolerance to labor                Principal Problem:   Postpartum care following cesarean delivery (12/5) Active Problems:   Active  labor  Doing well, stable.               Regular diet as tolerated Ambulate Routine post-op care  Kenard GowerAWSON, Jernee Murtaugh, M, MSN, CNM 12/01/2015, 8:44 AM

## 2015-12-01 NOTE — Lactation Note (Signed)
This note was copied from the chart of Rebecca Barnett. Lactation Consultation Note  Patient Name: Rebecca Barnett Reason for consult: Follow-up assessment Baby at 22 hr of life and mom reports poor latch. Mom was able to get baby comfortably latched to the L breast in football hold. Mom is able to manually express, colostrum noted bilaterally. Discussed pumping, supplementing, feeding frequency, baby belly size, voids, wt loss, breast changes, and nipple care. Reviewed OP services and support group.     Maternal Data    Feeding Feeding Type: Breast Fed Length of feed: 10 min  LATCH Score/Interventions Latch: Repeated attempts needed to sustain latch, nipple held in mouth throughout feeding, stimulation needed to elicit sucking reflex. Intervention(s): Adjust position;Assist with latch;Breast compression  Audible Swallowing: Spontaneous and intermittent Intervention(s): Alternate breast massage  Type of Nipple: Everted at rest and after stimulation  Comfort (Breast/Nipple): Soft / non-tender     Hold (Positioning): Assistance needed to correctly position infant at breast and maintain latch. Intervention(s): Support Pillows;Position options;Skin to skin  LATCH Score: 8  Lactation Tools Discussed/Used     Consult Status Consult Status: Follow-up Date: 12/02/15 Follow-up type: In-patient    Rulon Eisenmengerlizabeth E Aliscia Clayton Barnett, 4:55 PM

## 2015-12-01 NOTE — Lactation Note (Signed)
This note was copied from the chart of Rebecca Merna Crihfield. Lactation Consultation Note New mom sleepy from C-section. Baby rooting. In football hold assisted in latch. Mom has short shaft everted nipples. Baby can latch well when mouth opened wide. Hand expression taught w/noted colostrum. Mom done teach back instructions on latching. FOB at bedside and watching, praising mom when she gets baby latched by her self. Baby is eager popping and off and on frequently. Explained newborn behavior, I&O, STS, cluster feeding, supply and demand. Mom encouraged to feed baby 8-12 times/24 hours and with feeding cues. Referred to Baby and Me Book in Breastfeeding section Pg. 22-23 for position options and Proper latch demonstration.Encouraged comfort during BF so colostrum flows better and mom will enjoy the feeding longer. Taking deep breaths and breast massage during BF. WH/LC brochure given w/resources, support groups and LC services. Patient Name: Rebecca Barnett: 12/01/2015 Reason for consult: Initial assessment   Maternal Data Has patient been taught Hand Expression?: Yes Does the patient have breastfeeding experience prior to this delivery?: No  Feeding Feeding Type: Breast Fed Length of feed: 15 min (still BF off and on)  LATCH Score/Interventions Latch: Repeated attempts needed to sustain latch, nipple held in mouth throughout feeding, stimulation needed to elicit sucking reflex. Intervention(s): Adjust position;Assist with latch;Breast massage;Breast compression  Audible Swallowing: A few with stimulation Intervention(s): Skin to skin;Hand expression;Alternate breast massage  Type of Nipple: Everted at rest and after stimulation  Comfort (Breast/Nipple): Soft / non-tender     Hold (Positioning): Assistance needed to correctly position infant at breast and maintain latch. Intervention(s): Breastfeeding basics reviewed;Support Pillows;Position options;Skin to  skin  LATCH Score: 7  Lactation Tools Discussed/Used     Consult Status Consult Status: Follow-up Barnett: 12/02/15 Follow-up type: In-patient    Charyl DancerCARVER, Fotini Lemus G 12/01/2015, 6:21 AM

## 2015-12-01 NOTE — Anesthesia Postprocedure Evaluation (Signed)
Anesthesia Post Note  Patient: Rebecca Barnett  Procedure(s) Performed: Procedure(s) (LRB): CESAREAN SECTION (N/A)  Patient location during evaluation: Mother Baby Anesthesia Type: Epidural Level of consciousness: awake and alert Pain management: pain level controlled Vital Signs Assessment: post-procedure vital signs reviewed and stable Respiratory status: spontaneous breathing Cardiovascular status: stable Postop Assessment: no headache, no backache, epidural receding, patient able to bend at knees and no signs of nausea or vomiting Anesthetic complications: no    Last Vitals:  Filed Vitals:   12/01/15 0500 12/01/15 0905  BP: 120/61 123/70  Pulse: 88 92  Temp: 37.3 C 37.1 C  Resp: 16 16    Last Pain:  Filed Vitals:   12/01/15 0910  PainSc: 3                  Edison PaceWILKERSON,Jelene Albano

## 2015-12-01 NOTE — Anesthesia Postprocedure Evaluation (Signed)
Anesthesia Post Note  Patient: Rebecca Barnett  Procedure(s) Performed: * No procedures listed *  Patient location during evaluation: Mother Baby Anesthesia Type: Epidural Level of consciousness: awake and alert Pain management: pain level controlled Vital Signs Assessment: post-procedure vital signs reviewed and stable Respiratory status: spontaneous breathing Cardiovascular status: stable Postop Assessment: no headache, no backache, epidural receding, patient able to bend at knees and no signs of nausea or vomiting Anesthetic complications: no    Last Vitals:  Filed Vitals:   12/01/15 0500 12/01/15 0905  BP: 120/61 123/70  Pulse: 88 92  Temp: 37.3 C 37.1 C  Resp: 16 16    Last Pain:  Filed Vitals:   12/01/15 0910  PainSc: 3                  Edison PaceWILKERSON,Mikel Pyon

## 2015-12-02 ENCOUNTER — Encounter (HOSPITAL_COMMUNITY): Payer: Self-pay | Admitting: *Deleted

## 2015-12-02 NOTE — Progress Notes (Signed)
POSTOPERATIVE DAY # 2 S/P CS  S:         Reports feeling well but dry scratchy throat today             Tolerating po intake / no nausea / no vomiting / + flatus / no BM             Bleeding is light             Pain controlled with motrin and percocet             Up ad lib / ambulatory/ voiding QS  Newborn breast feeding    O:  VS: BP 125/78 mmHg  Pulse 76  Temp(Src) 98 F (36.7 C) (Oral)  Resp 18  Ht 5' 1.5" (1.562 m)  Wt 90.266 kg (199 lb)  BMI 37.00 kg/m2  SpO2 99%  Breastfeeding? Unknown   LABS:               Recent Labs  11/30/15 0630 12/01/15 0545  WBC 10.6* 24.1*  HGB 11.8* 9.7*  PLT 262 250               Bloodtype: --/--/A POS, A POS (12/05 0630)  Rubella: Immune (05/06 0000)                                             I&O: Intake/Output      12/06 0701 - 12/07 0700 12/07 0701 - 12/08 0700   P.O.     I.V. (mL/kg)     Other     Total Intake(mL/kg)     Urine (mL/kg/hr) 2100 (1)    Blood     Total Output 2100     Net -2100                       Physical Exam:             Alert and Oriented X3  Lungs: Clear and unlabored  Heart: regular rate and rhythm / no mumurs  Abdomen: soft, non-tender, non-distended, active BS             Fundus: firm, non-tender, U-1             Dressing intact honeycomb              Incision:  approximated with suture / no erythema / no ecchymosis / no drainage  Perineum: intact  Lochia: light  Extremities: trace edema, no calf pain or tenderness  A:        POD # 2 S/P CS            Mild ABL anemia  P:        Routine postoperative care              Anticipate DC tomorrow   Marlinda MikeBAILEY, TANYA CNM, MSN, [facility] Hospital[facility]ACNM 12/02/2015, 10:53 AM

## 2015-12-02 NOTE — Progress Notes (Signed)
Patient ID: Rebecca Barnett, female   DOB: 1986/01/30, 30 y.o.   MRN: [id number] [place], RN reporting pt's request for something for itching.  The pt is having what is reported as extreme itching on back - possibly contact dermatitis. Verbal orders given to give pt Benadryl and 1% Hydrocortisone cream applied to affected area(s) TID.  Kenard GowerAWSON, Kentaro Alewine, M , MSN, CNM 12/01/2015 10:15 PM

## 2015-12-03 DIAGNOSIS — R609 Edema, unspecified: Secondary | ICD-10-CM | POA: Diagnosis not present

## 2015-12-03 DIAGNOSIS — D62 Acute posthemorrhagic anemia: Secondary | ICD-10-CM | POA: Diagnosis not present

## 2015-12-03 MED ORDER — OXYCODONE-ACETAMINOPHEN 5-325 MG PO TABS: 1.0000 | ORAL_TABLET | ORAL | Status: AC | PRN

## 2015-12-03 MED ORDER — IBUPROFEN 600 MG PO TABS: 600.0000 mg | ORAL_TABLET | Freq: Four times a day (QID) | ORAL | Status: AC

## 2015-12-03 MED ORDER — HYDROCHLOROTHIAZIDE 12.5 MG PO CAPS: 12.5000 mg | ORAL_CAPSULE | Freq: Every day | ORAL | Status: DC

## 2015-12-03 MED ORDER — FERROUS SULFATE 325 (65 FE) MG PO TABS: 325.0000 mg | ORAL_TABLET | Freq: Every day | ORAL | Status: AC

## 2015-12-03 MED ORDER — PROMETHAZINE HCL 25 MG RE SUPP
12.5000 mg | Freq: Once | RECTAL | Status: DC
Start: 2015-12-03 — End: 2015-12-03
  Filled 2015-12-03: qty 1

## 2015-12-03 NOTE — Lactation Note (Signed)
This note was copied from the chart of Boy Kaimana Nutter. Lactation Consultation Note  Baby latched in football position on R side.  Rhythmical sucks and swallows observed. Baby had already breastfed for approx 25 min before R side. Encouraged mother to compress breast and undress if needed to keep baby active at the breast. Reviewed engorgement care and monitoring voids/stools. Parents are using syringe to supplement at this time.  Encouraged mother to post pump 10-15 4-6 times a day and give baby back volume pumped.  Patient Name: Boy Izell CarolinaKennitrish Szumski ZOXWR'UToday's Date: 12/03/2015 Reason for consult: Follow-up assessment   Maternal Data Formula Feeding for Exclusion: No Has patient been taught Hand Expression?: Yes Does the patient have breastfeeding experience prior to this delivery?: No  Feeding Feeding Type: Breast Fed Length of feed: 30 min  LATCH Score/Interventions Latch: Repeated attempts needed to sustain latch, nipple held in mouth throughout feeding, stimulation needed to elicit sucking reflex. (latched upon entering)  Audible Swallowing: Spontaneous and intermittent Intervention(s): Hand expression;Skin to skin  Type of Nipple: Everted at rest and after stimulation  Comfort (Breast/Nipple): Soft / non-tender     Hold (Positioning): Assistance needed to correctly position infant at breast and maintain latch. Intervention(s): Breastfeeding basics reviewed  LATCH Score: 8  Lactation Tools Discussed/Used WIC Program: No   Consult Status Consult Status: Complete Date: 12/11/15 Follow-up type: Out-patient    Dahlia ByesBerkelhammer, Ruth [facility] Center[facility] 12/03/2015, 2:28 PM

## 2015-12-03 NOTE — Progress Notes (Signed)
POD # 3  Subjective: Pt reports feeling well, ready for discharge/ Pain controlled with Motrin and Percocet Tolerating po/Voiding without problems/ No n/v/ Flatus present, +BM Activity: ad lib Bleeding is light Newborn info:  Information for the patient's newborn:  Herbert SetaBracey, Boy Anab [[id number]][[id number]]  female  / Circumcision: done/ Feeding: breast  Objective: VS: VS:  Filed Vitals:   12/01/15 1745 12/02/15 0625 12/02/15 1835 12/03/15 0631  BP: 124/77 125/78 127/76 121/75  Pulse: 84 76 80 92  Temp: 97.7 F (36.5 C) 98 F (36.7 C) 97.5 F (36.4 C) 98.1 F (36.7 C)  TempSrc: Oral Oral Oral Oral  Resp: 16 18 18 18   Height:      Weight:      SpO2:        I&O: Intake/Output      12/07 0701 - 12/08 0700 12/08 0701 - 12/09 0700   Urine (mL/kg/hr)     Total Output       Net              LABS:  Recent Labs  12/01/15 0545  WBC 24.1*  HGB 9.7*  PLT 250   Blood type: --/--/A POS, A POS (12/05 0630) Rubella: Immune (05/06 0000)           Physical Exam:  General: alert, cooperative and no distress CV: Regular rate and rhythm Resp: CTA bilaterally Abdomen: soft, nontender, normal bowel sounds Incision: Covered with Tegaderm and honeycomb dressing; no significant drainage, edema, bruising, or erythema; well approximated with suture and steri strips Uterine Fundus: firm, below umbilicus, nontender Lochia: minimal Ext: edema 2+ BLE and Homans sign is negative, no sign of DVT   Assessment: POD # 3/ G1P1001/ S/P C/Section d/t NRFHT, arrest of dilation Dependent edema ABL anemia Doing well and stable for discharge home  Plan: Discharge home RX's: Ibuprofen 600mg  po Q 6 hrs prn pain #40#30 Refill x 1 Percocet 5/325 1 - 2 tabs po every 4 hrs prn pain #30 Refill x 0 Ferrous Sulfate 1 po daily #30 Refill x 1 HCTZ 12.5 mg 1 po daily #5, no refill [facility] given    Signed: Donette LarryBHAMBRI, Chevez Sambrano, Dorris CarnesN, MSN,  CNM 12/03/2015, 9:31 AM

## 2015-12-03 NOTE — Lactation Note (Signed)
This note was copied from the chart of Rebecca Barnett. Lactation Consultation Note; Mom reports she tried to breast feed about 15 min ago and baby took a few sucks then off to sleep. Encouraged to call for assist when baby wakes. OP appointment made for 12/16 at 1 pm- first available. Suggested mom call and see if we have cancellation.  No questions at present.   Patient Name: Rebecca Izell CarolinaKennitrish Encalada [id number]'s Date: 12/03/2015 Reason for consult: Follow-up assessment   Maternal Data Formula Feeding for Exclusion: No Has patient been taught Hand Expression?: Yes Does the patient have breastfeeding experience prior to this delivery?: No  Feeding Feeding Type: Breast Fed Length of feed: 5 min  LATCH Score/Interventions Latch: Repeated attempts needed to sustain latch, nipple held in mouth throughout feeding, stimulation needed to elicit sucking reflex.  Audible Swallowing: None  Type of Nipple: Everted at rest and after stimulation  Comfort (Breast/Nipple): Soft / non-tender     Hold (Positioning): Assistance needed to correctly position infant at breast and maintain latch. Intervention(s): Breastfeeding basics reviewed  LATCH Score: 6  Lactation Tools Discussed/Used WIC Program: No   Consult Status Consult Status: Follow-up Date: 12/11/15 Follow-up type: Out-patient    Pamelia HoitWeeks, Annelies Coyt D 12/03/2015, 11:34 AM

## 2015-12-03 NOTE — Discharge Summary (Signed)
DISCHARGE SUMMARY:  Patient ID: Rebecca Barnett MRN: [id number] DOB/AGE: January 10, 1986 29 y.o.  Admit date: 11/30/2015 Admission Diagnoses: 39.[redacted] weeks gestation, SROM   Discharge date: 12/03/2015 Discharge Diagnoses: S/P C/S on 11/30/15, Dependent edema, ABL anemia        Prenatal history: G1P1001   EDC: 12/03/2015, Alternate EDD Entry  Prenatal care at [facility] since [redacted] wks gestation. Primary provider: Dr. Cherly Hensen Prenatal course uncomplicated.  Prenatal labs: ABO, Rh: --/--/A POS, A POS (12/05 0630)  Antibody: NEG (12/05 0630) Rubella: Immune RPR: Non Reactive (12/05 0630)  HBsAg: Negative (05/06 0000)  HIV: Non-reactive (05/06 0000)  GBS: Negative (11/14 0000)  GTT: 99  Medical / Surgical History :  Past medical history:  Past Medical History  Diagnosis Date  . HSV-2 (herpes simplex virus 2) infection     Past surgical history:  Past Surgical History  Procedure Laterality Date  . Cesarean section N/A 11/30/2015    Procedure: CESAREAN SECTION;  Surgeon: Maxie Better, MD;  Location: [facility];  Service: Obstetrics;  Laterality: N/A;     Medications on Admission: Prescriptions prior to admission  Medication Sig Dispense Refill Last Dose  . Prenatal Vit-Fe Fumarate-FA (PRENATAL MULTIVITAMIN) TABS tablet Take 1 tablet by mouth daily at 12 noon.   11/29/2015 at Unknown time  . [DISCONTINUED] clonazePAM (KLONOPIN) 0.5 MG tablet 1-2 for sleep as needed 30 tablet 2     Allergies: Review of patient's allergies indicates no known allergies.   Intrapartum Course:  Admitted for SROM, Pitocin, epidural, variable decels, amnioinfusion, arrest of dilation, fetal intolerance to labor, urgent PCS.    Postpartum Course: Complicated by ABL anemia, discharged home on POD #3.  Physical Exam:   VSS: Blood pressure 121/75, pulse 92, temperature 98.1 F (36.7 C), temperature source Oral, resp. rate 18, height 5' 1.5" (1.562 m), weight 90.266 kg (199 lb),  SpO2 99 %, unknown if currently breastfeeding.  LABS:  Recent Labs  12/01/15 0545  WBC 24.1*  HGB 9.7*  PLT 250    General: alert and oriented x3 Heart: RRR Lungs: CTA bilaterally GI: soft, non-tender, non-distended, BS x4 Lochia: small Uterus: firm below umbilicus Incision: well approximated with suture and steri strips; honeycomb dressing-no significant erythema, drainage, or edema Extremities: 2+ LE edema, Homans neg   Newborn Data Live born female  Birth Weight: 6 lb 1.5 oz (2765 g) APGAR: 8, 9  See operative report for further details  Home with mother.  Discharge Instructions:  Wound Care: keep clean and dry / remove honeycomb POD 6 Postpartum Instructions: [facility] discharge booklet - instructions reviewed Medications:    Medication List    TAKE these medications        ferrous sulfate 325 (65 FE) MG tablet  Take 1 tablet (325 mg total) by mouth daily with breakfast.     hydrochlorothiazide 12.5 MG capsule  Commonly known as:  MICROZIDE  Take 1 capsule (12.5 mg total) by mouth daily.     ibuprofen 600 MG tablet  Commonly known as:  ADVIL,MOTRIN  Take 1 tablet (600 mg total) by mouth every 6 (six) hours.     oxyCODONE-acetaminophen 5-325 MG tablet  Commonly known as:  PERCOCET/ROXICET  Take 1-2 tablets by mouth every 4 (four) hours as needed (for pain scale greater than 7).     prenatal multivitamin Tabs tablet  Take 1 tablet by mouth daily at 12 noon.            Follow-up Information  Follow up with COUSINS,SHERONETTE A, MD. Schedule an appointment as soon as possible for a visit in 6 weeks.   Specialty:  Obstetrics and Gynecology   Contact information:   [address] [place] [id number] [phone]         Signed: Donette LarryBHAMBRI, Aysen Shieh, Dorris CarnesN MSN, CNM 12/03/2015, 9:34 AM

## 2015-12-03 NOTE — Lactation Note (Signed)
This note was copied from the chart of Rebecca Deaisa Chenard. Lactation Consultation Note; Baby very fussy when I went into room. Attempted to latch baby too fussy and would not latch. Syringe fed formula 5 cc's formula to calm. Dr Azucena Kubaeid in and diaper changed baby continues fussy. Another 5 cc's formula syringe fed to calm Would take a few sucks then off to sleep. Mom to eat breakfast then try again. Encouraged to watch for feeding cues and fed as soon as she sees them before he gets frantic. Mom easily able to hand express whitish milk and reports breasts are feeling fuller this morning. 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 To call for assist when baby wakes for feeding. Patient Name: Rebecca Barnett Reason for consult: Follow-up assessment   Maternal Data Formula Feeding for Exclusion: No Has patient been taught Hand Expression?: Yes Does the patient have breastfeeding experience prior to this delivery?: No  Feeding Feeding Type: Breast Fed Length of feed: 5 min  LATCH Score/Interventions Latch: Repeated attempts needed to sustain latch, nipple held in mouth throughout feeding, stimulation needed to elicit sucking reflex.  Audible Swallowing: None  Type of Nipple: Everted at rest and after stimulation  Comfort (Breast/Nipple): Soft / non-tender     Hold (Positioning): Assistance needed to correctly position infant at breast and maintain latch. Intervention(s): Breastfeeding basics reviewed  LATCH Score: 6  Lactation Tools Discussed/Used WIC Program: No   Consult Status Consult Status: Follow-up Date: 12/03/15    Pamelia HoitWeeks, Deola Rewis D Barnett, 10:43 AM

## 2015-12-07 ENCOUNTER — Inpatient Hospital Stay (HOSPITAL_COMMUNITY)
Admission: AD | Admit: 2015-12-07 | Discharge: 2015-12-07 | Disposition: A | Discharge status: Home / Self Care | Payer: 59 | Source: Ambulatory Visit | Attending: Obstetrics & Gynecology | Admitting: Obstetrics & Gynecology

## 2015-12-07 ENCOUNTER — Encounter (HOSPITAL_COMMUNITY): Payer: Self-pay | Admitting: *Deleted

## 2015-12-07 DIAGNOSIS — R609 Edema, unspecified: Secondary | ICD-10-CM

## 2015-12-07 DIAGNOSIS — O135 Gestational [pregnancy-induced] hypertension without significant proteinuria, complicating the puerperium: Secondary | ICD-10-CM | POA: Insufficient documentation

## 2015-12-07 DIAGNOSIS — O165 Unspecified maternal hypertension, complicating the puerperium: Secondary | ICD-10-CM

## 2015-12-07 DIAGNOSIS — Z87891 Personal history of nicotine dependence: Secondary | ICD-10-CM | POA: Diagnosis not present

## 2015-12-07 DIAGNOSIS — R51 Headache: Secondary | ICD-10-CM | POA: Diagnosis present

## 2015-12-07 LAB — COMPREHENSIVE METABOLIC PANEL
ALT: 33 U/L (ref 14–54)
AST: 23 U/L (ref 15–41)
Albumin: 3.3 g/dL — ABNORMAL LOW (ref 3.5–5.0)
Alkaline Phosphatase: 113 U/L (ref 38–126)
Anion gap: 8 (ref 5–15)
BUN: 12 mg/dL (ref 6–20)
CHLORIDE: 107 mmol/L (ref 101–111)
CO2: 25 mmol/L (ref 22–32)
Calcium: 9.3 mg/dL (ref 8.9–10.3)
Creatinine, Ser: 0.57 mg/dL (ref 0.44–1.00)
Glucose, Bld: 95 mg/dL (ref 65–99)
POTASSIUM: 4 mmol/L (ref 3.5–5.1)
SODIUM: 140 mmol/L (ref 135–145)
Total Bilirubin: 0.3 mg/dL (ref 0.3–1.2)
Total Protein: 6.7 g/dL (ref 6.5–8.1)

## 2015-12-07 LAB — CBC
HCT: 30.9 % — ABNORMAL LOW (ref 36.0–46.0)
Hemoglobin: 10.4 g/dL — ABNORMAL LOW (ref 12.0–15.0)
MCH: 29.8 pg (ref 26.0–34.0)
MCHC: 33.7 g/dL (ref 30.0–36.0)
MCV: 88.5 fL (ref 78.0–100.0)
PLATELETS: 318 10*3/uL (ref 150–400)
RBC: 3.49 MIL/uL — AB (ref 3.87–5.11)
RDW: 14 % (ref 11.5–15.5)
WBC: 10.4 10*3/uL (ref 4.0–10.5)

## 2015-12-07 LAB — PROTEIN / CREATININE RATIO, URINE: CREATININE, URINE: 19 mg/dL

## 2015-12-07 LAB — URIC ACID: URIC ACID, SERUM: 5.5 mg/dL (ref 2.3–6.6)

## 2015-12-07 MED ORDER — HYDROCHLOROTHIAZIDE 12.5 MG PO CAPS: 12.5000 mg | ORAL_CAPSULE | Freq: Every day | ORAL | Status: AC

## 2015-12-07 MED ORDER — NIFEDIPINE ER OSMOTIC RELEASE 30 MG PO TB24
30.0000 mg | ORAL_TABLET | Freq: Every day | ORAL | Status: DC
  Administered 2015-12-07: 30 mg via ORAL
  Filled 2015-12-07: qty 1

## 2015-12-07 MED ORDER — NIFEDIPINE ER 30 MG PO TB24: 30.0000 mg | ORAL_TABLET | Freq: Every day | ORAL | Status: AC

## 2015-12-07 NOTE — Discharge Instructions (Signed)

## 2015-12-07 NOTE — MAU Provider Note (Signed)
History     CSN: [id number]  Arrival date and time: 12/07/15 1909   None     No chief complaint on file.  HPI Comments: G1P1001 s/p CS 1 week ago c/o Rt temporal HA x2 days, worsening this afternoon. Some relief after taking Midol. Blurred vision earlier. No epigastric pain. Intermittent periods of SOB, none currently. C/o worsening LE edema despite "fluid pill" and elevation. Home health nurse found BP elevated. Incisional pain and lochia minimal. Pregnancy uncomplicated. CS for fetal intolerance, postpartum course complicated only by ABL anemia and dependent edema.   OB History    Gravida Para Term Preterm AB TAB SAB Ectopic Multiple Living   _0 0 1      Past Medical History  Diagnosis Date  . HSV-2 (herpes simplex virus 2) infection     Past Surgical History  Procedure Laterality Date  . Cesarean section N/A 11/30/2015    Procedure: CESAREAN SECTION;  Surgeon: Servando Salina, MD;  Location: [facility];  Service: Obstetrics;  Laterality: N/A;    Family History  Problem Relation Age of Onset  . Alcoholism Father   . Depression Father   . Hypertension Mother   . Insomnia Mother     Social History  Substance Use Topics  . Smoking status: Former Research scientist (life sciences)  . Smokeless tobacco: Not on file  . Alcohol Use: Yes     Comment: social    Allergies: No Known Allergies  Prescriptions prior to admission  Medication Sig Dispense Refill Last Dose  . ferrous sulfate 325 (65 FE) MG tablet Take 1 tablet (325 mg total) by mouth daily with breakfast. 30 tablet 1   . hydrochlorothiazide (MICROZIDE) 12.5 MG capsule Take 1 capsule (12.5 mg total) by mouth daily. 5 capsule 0   . ibuprofen (ADVIL,MOTRIN) 600 MG tablet Take 1 tablet (600 mg total) by mouth every 6 (six) hours. 30 tablet 0   . oxyCODONE-acetaminophen (PERCOCET/ROXICET) 5-325 MG tablet Take 1-2 tablets by mouth every 4 (four) hours as needed (for pain scale greater than 7). 30 tablet 0   . Prenatal Vit-Fe  Fumarate-FA (PRENATAL MULTIVITAMIN) TABS tablet Take 1 tablet by mouth daily at 12 noon.   11/29/2015 at Unknown time    Review of Systems  Constitutional: Negative.   Eyes: Positive for blurred vision.  Respiratory: Positive for shortness of breath.   Cardiovascular: Positive for leg swelling.  Gastrointestinal: Negative.   Genitourinary: Negative.   Musculoskeletal: Negative.   Skin: Negative.   Neurological: Positive for headaches.  Endo/Heme/Allergies: Negative.   Psychiatric/Behavioral: Negative.    Physical Exam   Blood pressure 138/80, pulse 72, temperature 98.4 F (36.9 C), temperature source Oral, resp. rate 16, height _1  (1.549 m), weight 85.73 kg (189 lb), unknown if currently breastfeeding.  BP range: 138-150/80-86  Physical Exam  Constitutional: She is oriented to person, place, and time. She appears well-developed and well-nourished.  HENT:  Head: Normocephalic and atraumatic.  Neck: Normal range of motion. Neck supple.  Cardiovascular: Normal rate and regular rhythm.   Respiratory: Effort normal and breath sounds normal.  GI: Soft. Bowel sounds are normal. She exhibits no distension. There is no tenderness.  Low transverse incision-well approximated, steri strips intact; no edema, erythema, or drainage  Genitourinary:  deferred  Musculoskeletal: Normal range of motion. She exhibits edema.  3+ BLE  Neurological: She is alert and oriented to person, place, and time. She has normal reflexes.  No clonus  Skin: Skin  is warm and dry.  Psychiatric: She has a normal mood and affect.   Results for SINA, SUMPTER (MRN [id number]) as of 12/07/2015 21:13  Ref. Range 12/07/2015 19:15 12/07/2015 19:37  Sodium Latest Ref Range: 135-145 mmol/L  140  Potassium Latest Ref Range: 3.5-5.1 mmol/L  4.0  Chloride Latest Ref Range: 101-111 mmol/L  107  CO2 Latest Ref Range: 22-32 mmol/L  25  BUN Latest Ref Range: 6-20 mg/dL  12  Creatinine Latest Ref Range: 0.44-1.00  mg/dL  0.57  Calcium Latest Ref Range: 8.9-10.3 mg/dL  9.3  EGFR (Non-African Amer.) Latest Ref Range: >60 mL/min  >60  EGFR (African American) Latest Ref Range: >60 mL/min  >60  Glucose Latest Ref Range: 65-99 mg/dL  95  Anion gap Latest Ref Range: 5-15   8  Alkaline Phosphatase Latest Ref Range: 38-126 U/L  113  Albumin Latest Ref Range: 3.5-5.0 g/dL  3.3 (L)  Uric Acid, Serum Latest Ref Range: 2.3-6.6 mg/dL  5.5  AST Latest Ref Range: 15-41 U/L  23  ALT Latest Ref Range: 14-54 U/L  33  Total Protein Latest Ref Range: 6.5-8.1 g/dL  6.7  Total Bilirubin Latest Ref Range: 0.3-1.2 mg/dL  0.3  WBC Latest Ref Range: 4.0-10.5 K/uL  10.4  RBC Latest Ref Range: 3.87-5.11 MIL/uL  3.49 (L)  Hemoglobin Latest Ref Range: 12.0-15.0 g/dL  10.4 (L)  HCT Latest Ref Range: 36.0-46.0 %  30.9 (L)  MCV Latest Ref Range: 78.0-100.0 fL  88.5  MCH Latest Ref Range: 26.0-34.0 pg  29.8  MCHC Latest Ref Range: 30.0-36.0 g/dL  33.7  RDW Latest Ref Range: 11.5-15.5 %  14.0  Platelets Latest Ref Range: 150-400 K/uL  318  Total Protein, Urine Latest Units: mg/dL <6   Protein Creatinine Ratio Latest Ref Range: 0.00-0.15 mg/mgCre Pend   Creatinine, Urine Latest Units: mg/dL 19.00    MAU Course  Procedures  Assessment and Plan  Postpartum HTN-no evidence of PEC Dependent edema  Discharge home Procardia 30 mg XL daily #30, no refill HCTZ 12.5 mg po daily #7, no refill PIH precautions Follow-up in 2 [facility] at [facility]  Consult with Dr. Benjie Karvonen, agrees with A/P  [facility], Threasa Beards, N 12/07/2015, 7:35 PM

## 2015-12-07 NOTE — MAU Note (Signed)
Pt delivered via C/S one week ago with BP's elevating after delivery.  Pt c/o headache since Saturday afternoon and through today.  She took midol at 1700 with some relief. Home health nurse came to her house today and discovered BP 130's /80's.  Told to come in to MAU and be assessed.

## 2015-12-11 ENCOUNTER — Ambulatory Visit (HOSPITAL_COMMUNITY): Payer: 59

## 2016-01-12 DIAGNOSIS — Z130 Encounter for screening for diseases of the blood and blood-forming organs and certain disorders involving the immune mechanism: Secondary | ICD-10-CM | POA: Diagnosis not present

## 2016-03-15 ENCOUNTER — Telehealth (HOSPITAL_COMMUNITY): Payer: Self-pay | Admitting: Lactation Services

## 2016-03-15 NOTE — Telephone Encounter (Signed)
Rebecca Barnett called from work wanting assistance with trying to get baby to breastfeed better.  States she probably introduced the bottle too soon, within the first 3 weeks.  Mom has a big milk supply, pumping 60 oz per day.  Her let down may be too forceful as baby gags and spits on the breast and then won't feed.  Mom wishes to be able to pump while at work, and breastfeed at home.  Tips given such as pre pumping so when baby latched to breast, milk flow slower.  Slowly decreasing the length of time she pumps to decrease her milk supply to more what baby is taking.  Talked about trying a nipple shield to transition from bottles.  Also, suggested she try to feed baby bottle partly, and then try to latch baby on with a nipple shield.  Offered her to make an OP appointment, but suggested she try some of these ideas first.  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  Suggested she go to [employer] website and look for video on use of nipple shield.  To call us if she has more questions, or want to make an OP appointment.

## 2016-06-03 DIAGNOSIS — O906 Postpartum mood disturbance: Secondary | ICD-10-CM | POA: Diagnosis not present

## 2016-06-03 MED FILL — SERTRALINE HCL 50 MG TABLET: 50 | 30 days supply | Qty: 30 | Fill #0

## 2016-06-09 ENCOUNTER — Emergency Department (HOSPITAL_COMMUNITY)
Admission: EM | Admit: 2016-06-09 | Discharge: 2016-06-09 | Disposition: A | Discharge status: Home / Self Care | Payer: 59 | Attending: Emergency Medicine | Admitting: Emergency Medicine

## 2016-06-09 ENCOUNTER — Encounter (HOSPITAL_COMMUNITY): Payer: Self-pay | Admitting: *Deleted

## 2016-06-09 DIAGNOSIS — R002 Palpitations: Secondary | ICD-10-CM | POA: Insufficient documentation

## 2016-06-09 DIAGNOSIS — Z79899 Other long term (current) drug therapy: Secondary | ICD-10-CM | POA: Diagnosis not present

## 2016-06-09 DIAGNOSIS — I959 Hypotension, unspecified: Secondary | ICD-10-CM | POA: Diagnosis not present

## 2016-06-09 DIAGNOSIS — Z87891 Personal history of nicotine dependence: Secondary | ICD-10-CM | POA: Diagnosis not present

## 2016-06-09 HISTORY — DX: Gestational (pregnancy-induced) hypertension without significant proteinuria, unspecified trimester: O13.9

## 2016-06-09 LAB — COMPREHENSIVE METABOLIC PANEL
ALT: 18 U/L (ref 14–54)
ANION GAP: 8 (ref 5–15)
AST: 20 U/L (ref 15–41)
Albumin: 4.4 g/dL (ref 3.5–5.0)
Alkaline Phosphatase: 115 U/L (ref 38–126)
BUN: 15 mg/dL (ref 6–20)
CALCIUM: 8.9 mg/dL (ref 8.9–10.3)
CHLORIDE: 105 mmol/L (ref 101–111)
CO2: 25 mmol/L (ref 22–32)
CREATININE: 0.64 mg/dL (ref 0.44–1.00)
Glucose, Bld: 104 mg/dL — ABNORMAL HIGH (ref 65–99)
Potassium: 3.9 mmol/L (ref 3.5–5.1)
SODIUM: 138 mmol/L (ref 135–145)
Total Bilirubin: 0.7 mg/dL (ref 0.3–1.2)
Total Protein: 7 g/dL (ref 6.5–8.1)

## 2016-06-09 LAB — CBC WITH DIFFERENTIAL/PLATELET
Basophils Absolute: 0 10*3/uL (ref 0.0–0.1)
Basophils Relative: 0 %
EOS ABS: 0.1 10*3/uL (ref 0.0–0.7)
EOS PCT: 1 %
HCT: 36.9 % (ref 36.0–46.0)
Hemoglobin: 13 g/dL (ref 12.0–15.0)
LYMPHS ABS: 1.3 10*3/uL (ref 0.7–4.0)
LYMPHS PCT: 19 %
MCH: 30.2 pg (ref 26.0–34.0)
MCHC: 35.2 g/dL (ref 30.0–36.0)
MCV: 85.6 fL (ref 78.0–100.0)
MONO ABS: 0.6 10*3/uL (ref 0.1–1.0)
MONOS PCT: 8 %
Neutro Abs: 4.7 10*3/uL (ref 1.7–7.7)
Neutrophils Relative %: 72 %
PLATELETS: 271 10*3/uL (ref 150–400)
RBC: 4.31 MIL/uL (ref 3.87–5.11)
RDW: 13.2 % (ref 11.5–15.5)
WBC: 6.6 10*3/uL (ref 4.0–10.5)

## 2016-06-09 LAB — URINALYSIS, ROUTINE W REFLEX MICROSCOPIC
BILIRUBIN URINE: NEGATIVE
Glucose, UA: NEGATIVE mg/dL
Hgb urine dipstick: NEGATIVE
KETONES UR: NEGATIVE mg/dL
LEUKOCYTES UA: NEGATIVE
NITRITE: NEGATIVE
Protein, ur: NEGATIVE mg/dL
SPECIFIC GRAVITY, URINE: 1.006 (ref 1.005–1.030)
pH: 7 (ref 5.0–8.0)

## 2016-06-09 LAB — TSH: TSH: 0.781 u[IU]/mL (ref 0.350–4.500)

## 2016-06-09 LAB — I-STAT BETA HCG BLOOD, ED (MC, WL, AP ONLY): I-stat hCG, quantitative: <5 m[IU]/mL (ref <5)

## 2016-06-09 MED ORDER — SODIUM CHLORIDE 0.9 % IV BOLUS (SEPSIS)
1000.0000 mL | Freq: Once | INTRAVENOUS | Status: AC
  Administered 2016-06-09: 1000 mL via INTRAVENOUS

## 2016-06-09 MED ORDER — SODIUM CHLORIDE 0.9 % IV BOLUS (SEPSIS): 1000.0000 mL | Freq: Once | INTRAVENOUS | Status: DC

## 2016-06-09 NOTE — ED Provider Notes (Signed)
CSN: [id number]     Arrival date & time 06/09/16  0751 History   First MD Initiated Contact with Patient 06/09/16 0800     Chief Complaint  Patient presents with  . Hypotension    HPI Patient presents after an episode of lightheadedness, palpitations. Patient has a notable history of recent pregnancy, with delivery 6 months ago. Since that time she notes that she has had insomnia, anxiety, possible depression. Patient started on Zoloft one week ago. Assessment she also took Benadryl for her insomnia She notes that she has not been eating, drinking in a typical fashion for her. 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 No syncope, no chest pain, though there was a fluttering sensation in her chest. No nausea, vomiting. Patient had no improvement with water. Currently patient states that she feels slightly better, while resting in a gurney.   Past Medical History  Diagnosis Date  . HSV-2 (herpes simplex virus 2) infection   . Pregnancy induced hypertension    Past Surgical History  Procedure Laterality Date  . Cesarean section N/A 11/30/2015    Procedure: CESAREAN SECTION;  Surgeon: Maxie BetterSheronette Cousins, MD;  Location: [facility];  Service: Obstetrics;  Laterality: N/A;   Family History  Problem Relation Age of Onset  . Alcoholism Father   . Depression Father   . Hypertension Mother   . Insomnia Mother    Social History  Substance Use Topics  . Smoking status: Former Games developermoker  . Smokeless tobacco: Never Used  . Alcohol Use: Yes     Comment: social   OB History    Gravida Para Term Preterm AB TAB SAB Ectopic Multiple Living   1 1 1       0 1     Review of Systems  Constitutional:       Per HPI, otherwise negative  HENT:       Per HPI, otherwise negative  Respiratory:       Per HPI, otherwise negative  Cardiovascular:       Per HPI, otherwise negative  Gastrointestinal: Negative for vomiting.   Endocrine:       Negative aside from HPI  Genitourinary:       Neg aside from HPI   Musculoskeletal:       Per HPI, otherwise negative  Skin: Negative.   Neurological: Positive for weakness. Negative for syncope.  Psychiatric/Behavioral: The patient is nervous/anxious.       Allergies  Review of patient's allergies indicates no known allergies.  Home Medications   Prior to Admission medications   Medication Sig Start Date End Date Taking? Authorizing Provider  CAMILA 0.35 MG tablet TAKE 1 (ONE) TABLET, BY MOUTH, DAILY 05/27/16   Historical Provider, MD  ferrous sulfate 325 (65 FE) MG tablet Take 1 tablet (325 mg total) by mouth daily with breakfast. 12/03/15   Donette LarryMelanie Bhambri, CNM  hydrochlorothiazide (MICROZIDE) 12.5 MG capsule Take 1 capsule (12.5 mg total) by mouth daily. 12/07/15   Donette LarryMelanie Bhambri, CNM  ibuprofen (ADVIL,MOTRIN) 600 MG tablet Take 1 tablet (600 mg total) by mouth every 6 (six) hours. 12/03/15   Donette LarryMelanie Bhambri, CNM  NIFEdipine (PROCARDIA-XL/ADALAT CC) 30 MG 24 hr tablet Take 1 tablet (30 mg total) by mouth daily. 12/07/15   Donette LarryMelanie Bhambri, CNM  oxyCODONE-acetaminophen (PERCOCET/ROXICET) 5-325 MG tablet Take 1-2 tablets by mouth every 4 (four) hours as needed (for pain scale greater than 7). 12/03/15   Melanie  Denyse Amass, CNM  Prenatal Vit-Fe Fumarate-FA (PRENATAL MULTIVITAMIN) TABS tablet Take 1 tablet by mouth daily at 12 noon.    Historical Provider, MD  sertraline (ZOLOFT) 25 MG tablet Take 25 mg by mouth daily.    Historical Provider, MD   BP 98/75 mmHg  Pulse 77  Temp(Src) 97.6 F (36.4 C) (Oral)  Resp 14  SpO2 98%  Breastfeeding? Yes Physical Exam  Constitutional: She is oriented to person, place, and time. She appears well-developed and well-nourished. No distress.  HENT:  Head: Normocephalic and atraumatic.  Eyes: Conjunctivae and EOM are normal.  Cardiovascular: Normal rate and regular rhythm.   Pulmonary/Chest: Effort normal and breath sounds normal.  No stridor. No respiratory distress.  Abdominal: She exhibits no distension.  Musculoskeletal:  Nonpitting edema left lower leg  Neurological: She is alert and oriented to person, place, and time. No cranial nerve deficit.  Skin: Skin is warm and dry.  Psychiatric: She has a normal mood and affect.  Nursing note and vitals reviewed.   ED Course  Procedures (including critical care time) Labs Review Labs Reviewed  COMPREHENSIVE METABOLIC PANEL - Abnormal; Notable for the following:    Glucose, Bld 104 (*)    All other components within normal limits  CBC WITH DIFFERENTIAL/PLATELET  URINALYSIS, ROUTINE W REFLEX MICROSCOPIC (NOT AT [facility])  TSH  I-STAT BETA HCG BLOOD, ED ([facility], [facility], [facility] ONLY)       EKG Interpretation   Date/Time:  Thursday June 09 2016 08:30:13 EDT Ventricular Rate:  70 PR Interval:  154 QRS Duration: 85 QT Interval:  408 QTC Calculation: 440 R Axis:   60 Text Interpretation:  Sinus rhythm Normal ECG Confirmed by Gerhard Munch  MD ([id number]) on 06/09/2016 8:48:59 AM     10:27 AM Patient states that she feels substantially better. We discussed all findings at length, with the patient's husband present. Patient will follow-up with primary care.  MDM  Patient presents after episode of palpitations, lightheadedness. Patient is initially hypotensive, not substantially tachycardic, with no sustained arrhythmia. Patient's evaluation reassuring, with no evidence for ongoing arrhythmia, electrolyte abnormalities, anemia, infection. Patient improved here with fluid resuscitation, and etiology for today's presentation is likely multifactorial given her recent pregnancy, endorsed anorexia, insomnia. Patient counseled on home care, will follow-up with primary care.   Gerhard Munch, MD 06/09/16 1029

## 2016-06-09 NOTE — ED Notes (Signed)
Nurse at bedside starting IV will collect labs 

## 2016-06-09 NOTE — Discharge Instructions (Signed)
As discussed, your evaluation today has been largely reassuring.  But, it is important that you monitor your condition carefully, and do not hesitate to return to the ED if you develop new, or concerning changes in your condition. ? ?Otherwise, please follow-up with your physician for appropriate ongoing care. ? ?

## 2016-06-09 NOTE — ED Notes (Signed)
Patient ambulated to bathroom without assistance.  Patient c/o feeling "light headed," but denies dizziness.

## 2016-06-09 NOTE — ED Notes (Addendum)
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  Patient also states she felt as though she was tachycardic and had a flutter in her chest.  Patient became SOB as well.  She drank several cups of water, but continued to feel odd and staff checked her BP.  It was found to be 76/58 on the unit and patient was sent to ED.  Patient states she is starting to feel better and less dizzy.  Patient was dx with anxiety a week and a half ago by her OB/GYN and was started on Zoloft.  Patient states she takes the medication in the morning, but the new medication has been keeping her up at night.  Last night she took 12.5 mg of Benadryl to help her sleep. She Patient is a new mother (6 months).  Patient denies pain, but endorses mild nausea with onset of dizziness, which has since resolved.  Patient denies headache and changes in vision.  Patient had pregnancy induced hypertension shortly after delivery of her baby, but no longer takes anti-hypertensives.  Patient took a Vicodin last night because of knee pain.

## 2016-06-24 DIAGNOSIS — F331 Major depressive disorder, recurrent, moderate: Secondary | ICD-10-CM | POA: Diagnosis not present

## 2016-07-03 MED FILL — SERTRALINE HCL 50 MG TABLET: 50 | 30 days supply | Qty: 30 | Fill #1 | Status: TO

## 2016-07-06 DIAGNOSIS — O906 Postpartum mood disturbance: Secondary | ICD-10-CM | POA: Diagnosis not present

## 2016-07-27 DIAGNOSIS — R55 Syncope and collapse: Secondary | ICD-10-CM | POA: Diagnosis not present

## 2016-07-27 DIAGNOSIS — R600 Localized edema: Secondary | ICD-10-CM | POA: Diagnosis not present

## 2016-08-02 ENCOUNTER — Other Ambulatory Visit: Payer: Self-pay | Admitting: Surgery

## 2016-08-02 DIAGNOSIS — R600 Localized edema: Secondary | ICD-10-CM

## 2016-08-10 MED FILL — SERTRALINE HCL 50 MG TABLET: 50 | 30 days supply | Qty: 30 | Fill #0

## 2016-09-01 ENCOUNTER — Encounter: Payer: Self-pay | Admitting: Surgery

## 2016-09-05 ENCOUNTER — Encounter: Payer: 59 | Admitting: Surgery

## 2016-09-05 ENCOUNTER — Ambulatory Visit (HOSPITAL_COMMUNITY)
Admission: RE | Admit: 2016-09-05 | Discharge: 2016-09-05 | Disposition: A | Discharge status: Home / Self Care | Payer: 59 | Source: Ambulatory Visit | Attending: Surgery | Admitting: Surgery

## 2016-09-05 DIAGNOSIS — I8393 Asymptomatic varicose veins of bilateral lower extremities: Secondary | ICD-10-CM | POA: Diagnosis not present

## 2016-09-05 DIAGNOSIS — R609 Edema, unspecified: Secondary | ICD-10-CM | POA: Insufficient documentation

## 2016-09-05 DIAGNOSIS — R600 Localized edema: Secondary | ICD-10-CM

## 2016-09-19 MED FILL — SERTRALINE HCL 50 MG TABLET: 50 | 30 days supply | Qty: 30 | Fill #1 | Status: TO

## 2016-09-23 DIAGNOSIS — M129 Arthropathy, unspecified: Secondary | ICD-10-CM | POA: Diagnosis not present

## 2016-09-23 DIAGNOSIS — Z0000 Encounter for general adult medical examination without abnormal findings: Secondary | ICD-10-CM | POA: Diagnosis not present

## 2016-09-23 DIAGNOSIS — E559 Vitamin D deficiency, unspecified: Secondary | ICD-10-CM | POA: Diagnosis not present

## 2016-09-23 DIAGNOSIS — R0602 Shortness of breath: Secondary | ICD-10-CM | POA: Diagnosis not present

## 2016-09-23 DIAGNOSIS — R5383 Other fatigue: Secondary | ICD-10-CM | POA: Diagnosis not present

## 2016-09-23 DIAGNOSIS — Z114 Encounter for screening for human immunodeficiency virus [HIV]: Secondary | ICD-10-CM | POA: Diagnosis not present

## 2016-10-07 DIAGNOSIS — E0590 Thyrotoxicosis, unspecified without thyrotoxic crisis or storm: Secondary | ICD-10-CM | POA: Diagnosis not present

## 2016-10-07 DIAGNOSIS — R635 Abnormal weight gain: Secondary | ICD-10-CM | POA: Diagnosis not present

## 2016-10-07 DIAGNOSIS — E559 Vitamin D deficiency, unspecified: Secondary | ICD-10-CM | POA: Diagnosis not present

## 2016-10-07 DIAGNOSIS — R945 Abnormal results of liver function studies: Secondary | ICD-10-CM | POA: Diagnosis not present

## 2016-11-04 DIAGNOSIS — R635 Abnormal weight gain: Secondary | ICD-10-CM | POA: Diagnosis not present

## 2016-11-04 DIAGNOSIS — K5903 Drug induced constipation: Secondary | ICD-10-CM | POA: Diagnosis not present

## 2016-11-10 MED FILL — SERTRALINE HCL 50 MG TABLET: 50 | 30 days supply | Qty: 30 | Fill #0

## 2016-12-03 DIAGNOSIS — Z79899 Other long term (current) drug therapy: Secondary | ICD-10-CM | POA: Diagnosis not present

## 2016-12-03 DIAGNOSIS — K219 Gastro-esophageal reflux disease without esophagitis: Secondary | ICD-10-CM | POA: Diagnosis not present

## 2016-12-03 DIAGNOSIS — R635 Abnormal weight gain: Secondary | ICD-10-CM | POA: Diagnosis not present

## 2016-12-07 MED FILL — PHENTERMINE 37.5 MG TABLET: 37.5 | 30 days supply | Qty: 30 | Fill #0

## 2016-12-27 MED FILL — SERTRALINE HCL 50 MG TABLET: 50 | 30 days supply | Qty: 30 | Fill #1

## 2019-10-07 ENCOUNTER — Ambulatory Visit (INDEPENDENT_AMBULATORY_CARE_PROVIDER_SITE_OTHER): Payer: 59 | Admitting: Bariatrics

## 2019-10-21 ENCOUNTER — Ambulatory Visit (INDEPENDENT_AMBULATORY_CARE_PROVIDER_SITE_OTHER): Payer: 59 | Admitting: Bariatrics

## 2022-05-04 ENCOUNTER — Other Ambulatory Visit: Payer: Self-pay | Admitting: Obstetrics and Gynecology

## 2022-05-04 DIAGNOSIS — N6452 Nipple discharge: Secondary | ICD-10-CM

## 2022-05-27 ENCOUNTER — Ambulatory Visit
Admission: RE | Admit: 2022-05-27 | Discharge: 2022-05-27 | Disposition: A | Discharge status: Home / Self Care | Payer: 59 | Source: Ambulatory Visit | Attending: Obstetrics and Gynecology | Admitting: Obstetrics and Gynecology

## 2022-05-27 DIAGNOSIS — N6452 Nipple discharge: Secondary | ICD-10-CM

## 2022-06-02 ENCOUNTER — Other Ambulatory Visit: Payer: Self-pay | Admitting: Family Medicine

## 2022-06-02 DIAGNOSIS — R2242 Localized swelling, mass and lump, left lower limb: Secondary | ICD-10-CM

## 2022-06-06 ENCOUNTER — Other Ambulatory Visit: Payer: 59

## 2022-06-07 ENCOUNTER — Other Ambulatory Visit: Payer: 59

## 2022-06-09 ENCOUNTER — Ambulatory Visit
Admission: RE | Admit: 2022-06-09 | Discharge: 2022-06-09 | Disposition: A | Discharge status: Home / Self Care | Payer: 59 | Source: Ambulatory Visit | Attending: Family Medicine | Admitting: Family Medicine

## 2022-06-09 DIAGNOSIS — R2242 Localized swelling, mass and lump, left lower limb: Secondary | ICD-10-CM

## 2022-06-09 IMAGING — US US EXTREM LOW VENOUS*L*
1 series · 13 of 24 positions shown · non-contrast
Comparison: None Available.

CLINICAL DATA: Left lower extremity edema.



[Series 1: us extrem low venous*left* · 0.06mm/px · 24 acquisitions, 13 frames shown]
[im 1/24]
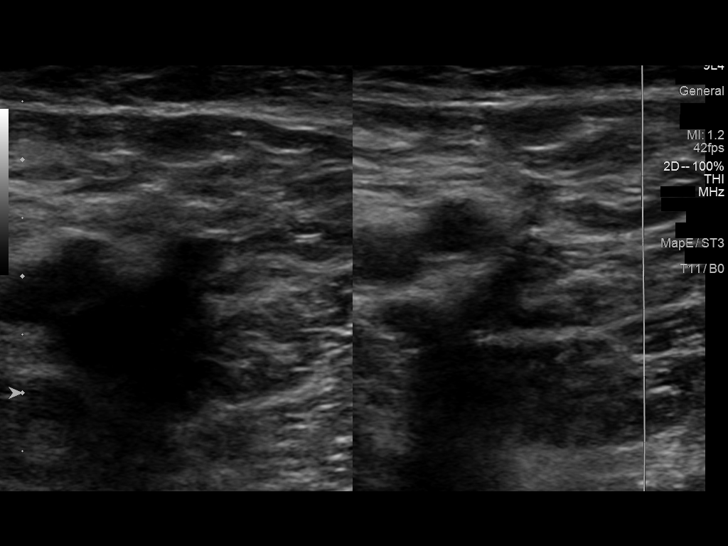
[im 3/24]
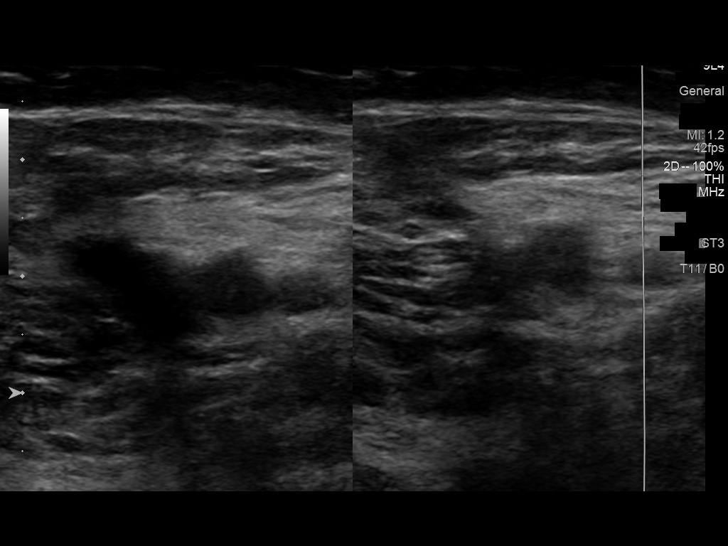
[im 5/24]
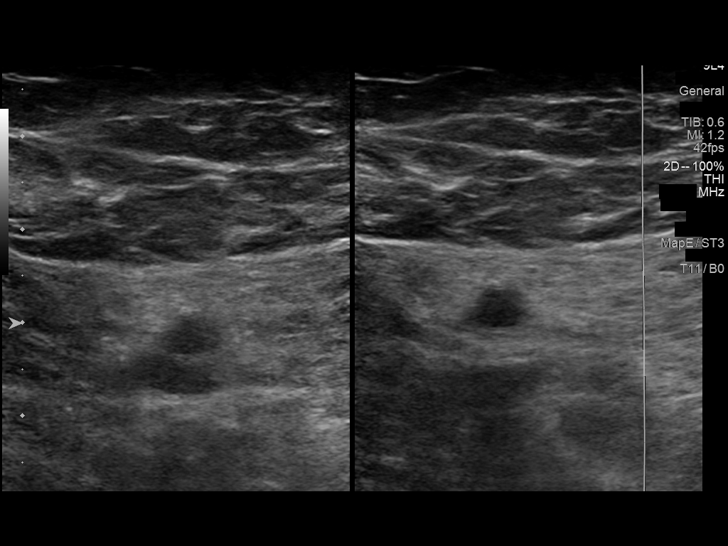
[im 7/24]
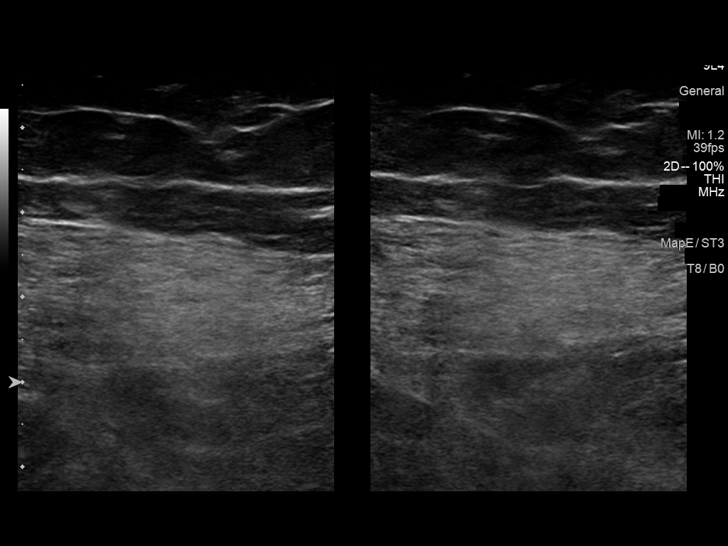
[im 9/24]
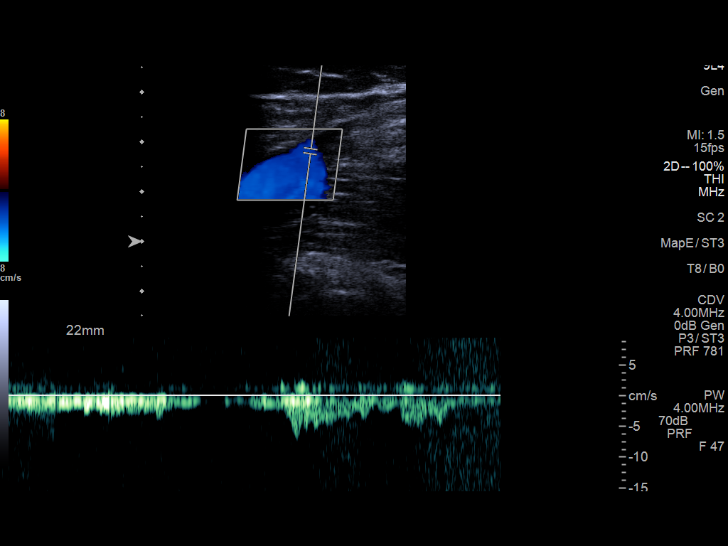
[im 11/24]
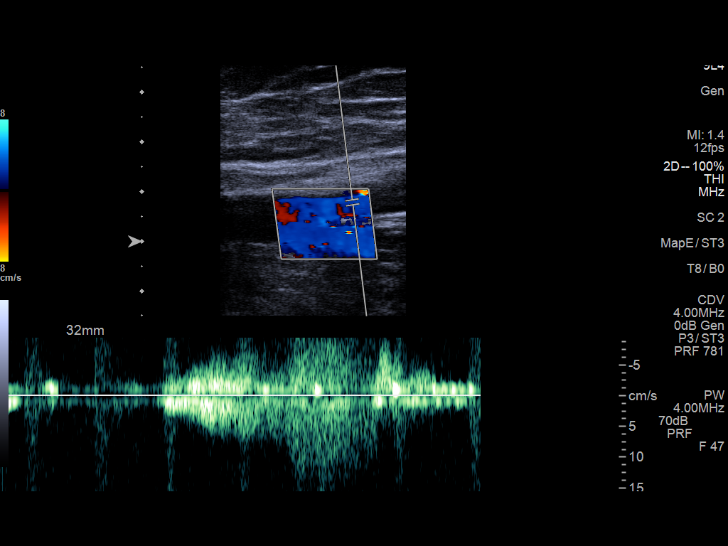
[im 14/24]
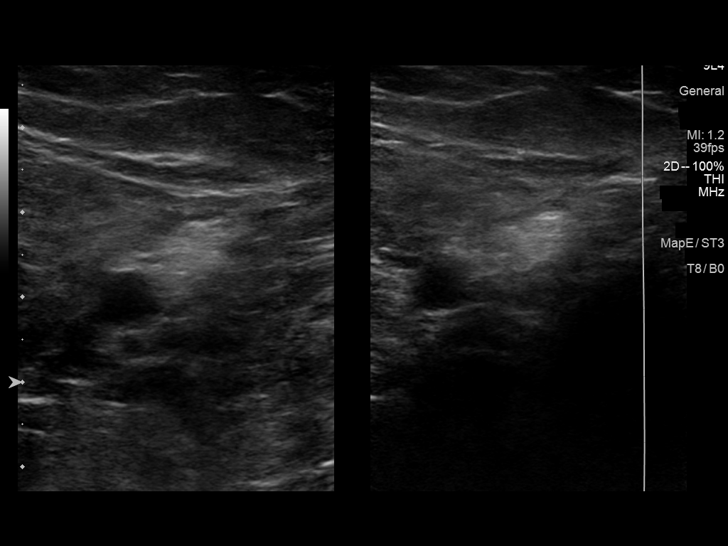
[im 15/24]
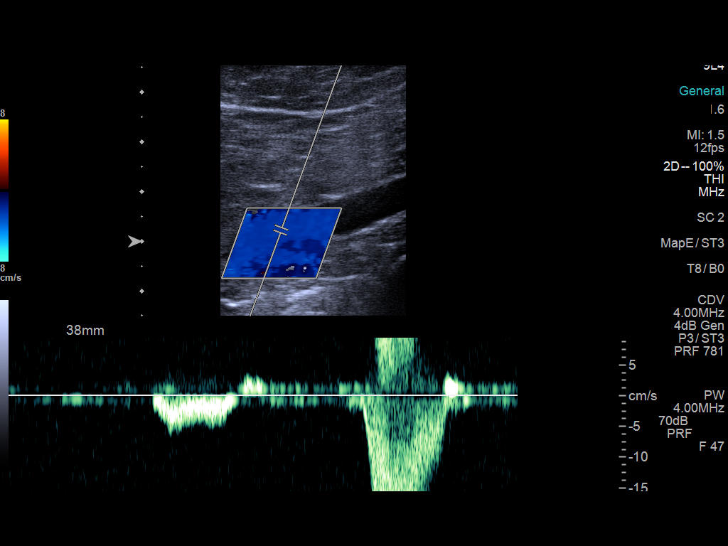
[im 17/24]
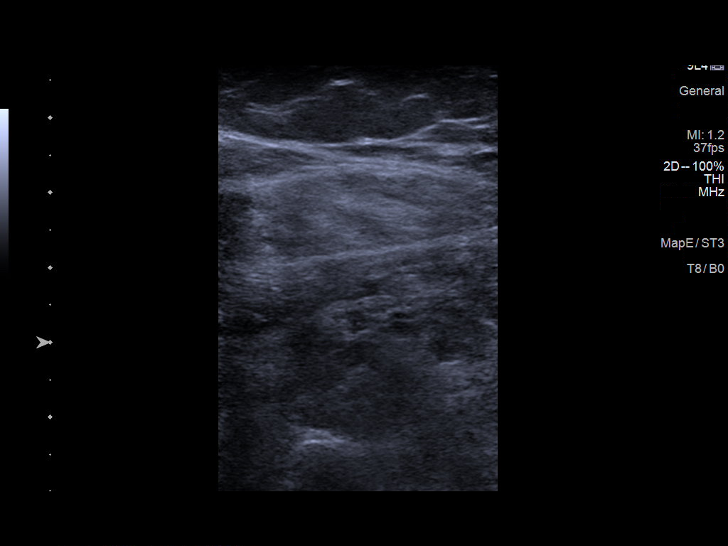
[im 18/24]
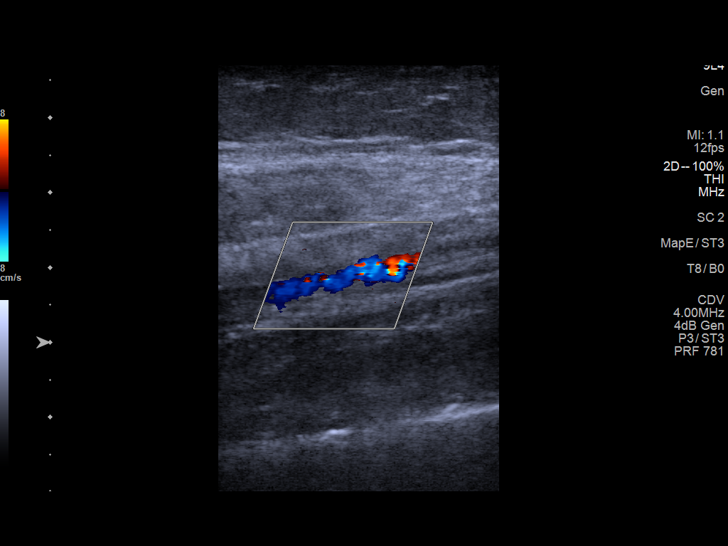
[im 20/24]
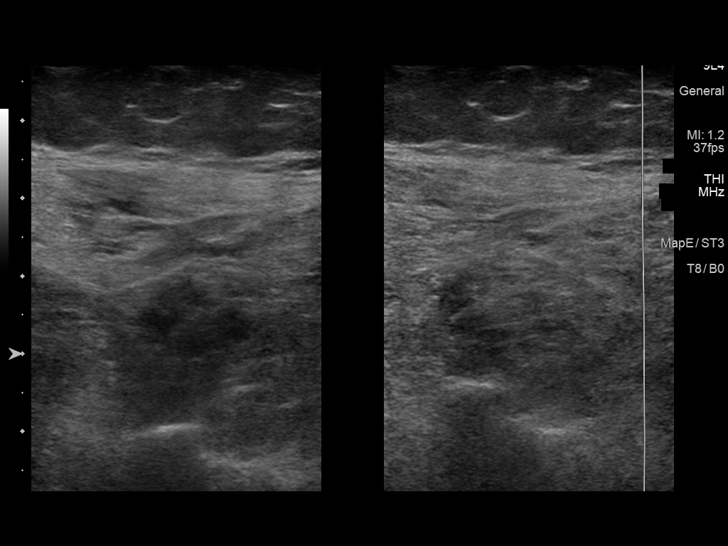
[im 22/24]
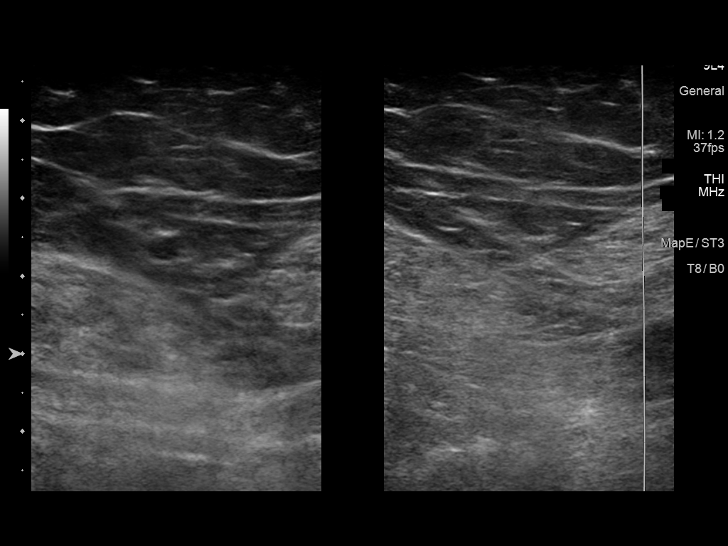
[im 24/24]
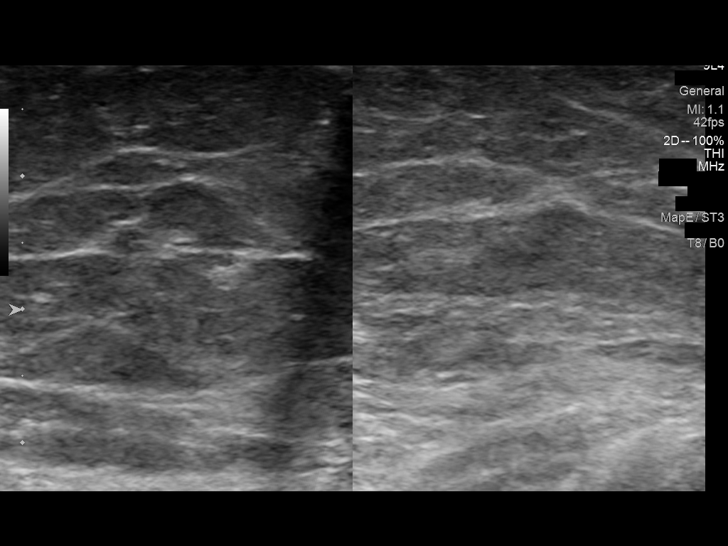

[13 of 24 positions shown; findings below may reference images not displayed]

FINDINGS: Contralateral Common Femoral Vein: Respiratory phasicity is normal
and symmetric with the symptomatic side. No evidence of thrombus.
Normal compressibility.

Common Femoral Vein: No evidence of thrombus. Normal
compressibility, respiratory phasicity and response to augmentation.

Saphenofemoral Junction: No evidence of thrombus. Normal
compressibility and flow on color Doppler imaging.

Profunda Femoral Vein: No evidence of thrombus. Normal
compressibility and flow on color Doppler imaging.

Femoral Vein: No evidence of thrombus. Normal compressibility,
respiratory phasicity and response to augmentation.

Popliteal Vein: No evidence of thrombus. Normal compressibility,
respiratory phasicity and response to augmentation.

Calf Veins: No evidence of thrombus. Normal compressibility and flow
on color Doppler imaging.

Superficial Great Saphenous Vein: No evidence of thrombus. Normal
compressibility.

Venous Reflux:  None.

Other Findings: No evidence of superficial thrombophlebitis or
abnormal fluid collection.
IMPRESSION: No evidence of left lower extremity deep venous thrombosis.
